# Patient Record
Sex: Female | Born: 1945
Health system: Southern US, Community
[De-identification: ages and names within clinical notes are randomized; demographics above are authoritative.]

## PROBLEM LIST (undated history)

## (undated) DIAGNOSIS — I219 Acute myocardial infarction, unspecified: Secondary | ICD-10-CM

## (undated) DIAGNOSIS — E7800 Pure hypercholesterolemia, unspecified: Secondary | ICD-10-CM

## (undated) DIAGNOSIS — I10 Essential (primary) hypertension: Secondary | ICD-10-CM

## (undated) DIAGNOSIS — I2510 Atherosclerotic heart disease of native coronary artery without angina pectoris: Secondary | ICD-10-CM

## (undated) HISTORY — PX: BREAST CYST EXCISION: SHX579

## (undated) HISTORY — PX: TONSILLECTOMY: SUR1361

---

## 2004-02-10 ENCOUNTER — Ambulatory Visit (HOSPITAL_COMMUNITY): Admission: RE | Admit: 2004-02-10 | Discharge: 2004-02-10 | Payer: Self-pay | Admitting: Family Medicine

## 2005-07-03 HISTORY — PX: CORONARY ANGIOPLASTY WITH STENT PLACEMENT: SHX49

## 2005-07-13 ENCOUNTER — Ambulatory Visit (HOSPITAL_COMMUNITY): Admission: RE | Admit: 2005-07-13 | Discharge: 2005-07-13 | Payer: Self-pay | Admitting: *Deleted

## 2005-07-20 ENCOUNTER — Ambulatory Visit (HOSPITAL_COMMUNITY): Admission: RE | Admit: 2005-07-20 | Discharge: 2005-07-21 | Payer: Self-pay | Admitting: Cardiology

## 2009-09-02 DIAGNOSIS — I2510 Atherosclerotic heart disease of native coronary artery without angina pectoris: Secondary | ICD-10-CM

## 2009-09-02 HISTORY — DX: Atherosclerotic heart disease of native coronary artery without angina pectoris: I25.10

## 2010-10-24 ENCOUNTER — Encounter: Payer: Self-pay | Admitting: Family Medicine

## 2012-08-12 ENCOUNTER — Emergency Department (HOSPITAL_COMMUNITY)
Admission: EM | Admit: 2012-08-12 | Discharge: 2012-08-12 | Disposition: A | Discharge status: Home / Self Care | Payer: BC Managed Care – PPO | Attending: Emergency Medicine | Admitting: Emergency Medicine

## 2012-08-12 ENCOUNTER — Encounter (HOSPITAL_COMMUNITY): Payer: Self-pay | Admitting: *Deleted

## 2012-08-12 ENCOUNTER — Emergency Department (HOSPITAL_COMMUNITY)
Admission: EM | Admit: 2012-08-12 | Discharge: 2012-08-12 | Disposition: A | Discharge status: Nursing Home (Includes ICF) | Payer: BC Managed Care – PPO | Source: Home / Self Care | Attending: Family Medicine | Admitting: Family Medicine

## 2012-08-12 ENCOUNTER — Encounter (HOSPITAL_COMMUNITY): Payer: Self-pay

## 2012-08-12 DIAGNOSIS — I252 Old myocardial infarction: Secondary | ICD-10-CM | POA: Insufficient documentation

## 2012-08-12 DIAGNOSIS — L03211 Cellulitis of face: Secondary | ICD-10-CM

## 2012-08-12 DIAGNOSIS — K047 Periapical abscess without sinus: Secondary | ICD-10-CM | POA: Insufficient documentation

## 2012-08-12 DIAGNOSIS — E7800 Pure hypercholesterolemia, unspecified: Secondary | ICD-10-CM | POA: Insufficient documentation

## 2012-08-12 DIAGNOSIS — Z79899 Other long term (current) drug therapy: Secondary | ICD-10-CM | POA: Insufficient documentation

## 2012-08-12 DIAGNOSIS — I10 Essential (primary) hypertension: Secondary | ICD-10-CM | POA: Insufficient documentation

## 2012-08-12 DIAGNOSIS — I2510 Atherosclerotic heart disease of native coronary artery without angina pectoris: Secondary | ICD-10-CM | POA: Insufficient documentation

## 2012-08-12 DIAGNOSIS — Z7982 Long term (current) use of aspirin: Secondary | ICD-10-CM | POA: Insufficient documentation

## 2012-08-12 HISTORY — DX: Essential (primary) hypertension: I10

## 2012-08-12 HISTORY — DX: Acute myocardial infarction, unspecified: I21.9

## 2012-08-12 HISTORY — DX: Pure hypercholesterolemia, unspecified: E78.00

## 2012-08-12 HISTORY — DX: Atherosclerotic heart disease of native coronary artery without angina pectoris: I25.10

## 2012-08-12 MED ORDER — HYDROCODONE-ACETAMINOPHEN 5-500 MG PO TABS: 1.0000 | ORAL_TABLET | Freq: Four times a day (QID) | ORAL | Status: DC | PRN

## 2012-08-12 MED ORDER — HYDROCODONE-ACETAMINOPHEN 5-325 MG PO TABS: 1.0000 | ORAL_TABLET | Freq: Once | ORAL | Status: DC

## 2012-08-12 MED ORDER — PENICILLIN V POTASSIUM 250 MG PO TABS: 250.0000 mg | ORAL_TABLET | Freq: Four times a day (QID) | ORAL | Status: AC

## 2012-08-12 NOTE — ED Notes (Signed)
Registration was in room with pt and she left prior to receiving pain medication. RN was waiting for medication to cross over in pyxis.

## 2012-08-12 NOTE — ED Provider Notes (Signed)
History     CSN: [id number]  Arrival date & time 08/12/12  1137   First MD Initiated Contact with Patient 08/12/12 1137      Chief Complaint  Patient presents with  . Facial Swelling  . Dental Pain    (Consider location/radiation/quality/duration/timing/severity/associated sxs/prior treatment) Patient is a 66 y.o. female presenting with tooth pain. The history is provided by the patient and a relative.  Dental PainThe primary symptoms include mouth pain and fever. The symptoms began yesterday (dental pain started on fri with facial swelling started on sat am, now worse.). The symptoms are worsening. The symptoms are chronic. The symptoms occur constantly.  Additional symptoms include: dental sensitivity to temperature, gum swelling, jaw pain, facial swelling and trouble swallowing. Medical issues include: periodontal disease.    Past Medical History  Diagnosis Date  . Coronary artery disease   . Myocardial infarction   . Hypertension   . Hypercholesteremia     Past Surgical History  Procedure Date  . Coronary angioplasty with stent placement     No family history on file.  History  Substance Use Topics  . Smoking status: Never Smoker   . Smokeless tobacco: Not on file  . Alcohol Use: No    OB History    Grav Para Term Preterm Abortions TAB SAB Ect Mult Living                  Review of Systems  Constitutional: Positive for fever.  HENT: Positive for facial swelling, trouble swallowing and dental problem.     Allergies  Review of patient's allergies indicates no known allergies.  Home Medications   Current Outpatient Rx  Name  Route  Sig  Dispense  Refill  . ASPIRIN 81 MG PO TABS   Oral   Take 81 mg by mouth daily.         Marland Kitchen PLAVIX PO   Oral   Take by mouth.         Marland Kitchen UNKNOWN TO PATIENT      HTN med         . UNKNOWN TO PATIENT      Cholesterol med           BP 136/70  Pulse 89  Temp 99.4 F (37.4 C) (Oral)  Resp 20  SpO2  100%  Physical Exam  Nursing note and vitals reviewed. Constitutional: She is oriented to person, place, and time. She appears distressed.  HENT:  Right Ear: External ear normal.  Left Ear: External ear normal.  Mouth/Throat:    Eyes: Pupils are equal, round, and reactive to light.  Neck: Normal range of motion. Neck supple.  Lymphadenopathy:    She has cervical adenopathy.  Neurological: She is alert and oriented to person, place, and time.  Skin: Skin is warm and dry.    ED Course  Procedures (including critical care time)  Labs Reviewed - No data to display No results found.   1. Dental abscess   2. Facial cellulitis       MDM          Linna Hoff, MD 08/12/12 1244

## 2012-08-12 NOTE — ED Notes (Signed)
Upon discharge of pt, pt rates pain 10/10.  Verbal order received from EDP for pain medication prior to discharge.

## 2012-08-12 NOTE — ED Notes (Signed)
Started with right lower dental pain on 11/8 - has since gotten significant swelling to right jaw and lower lip.  Has appt with dentist tomorrow.  Has been applying Orajel, but not taking any meds for pain.  Denies any tongue swelling or fevers.

## 2012-08-12 NOTE — ED Provider Notes (Addendum)
History  This chart was scribed for Ann Sprout, Ann Patel by Erskine Emery. This patient was seen in room TR10C/TR10C and the patient's care was started at 14:34.   CSN: [id number]  Arrival date & time 08/12/12  1259   First Ann Patel Initiated Contact with Patient 08/12/12 1434      Chief Complaint  Patient presents with  . Facial Swelling    (Consider location/radiation/quality/duration/timing/severity/associated sxs/prior treatment) The history is provided by the patient. No language interpreter was used.  Ann Patel is a 66 y.o. female who presents to the Emergency Department complaining of significant facial swelling due to a bad tooth since yesterday. Pt reports some associated difficulty swallowing. Pt has an appointment to see a dentist tomorrow.   Past Medical History  Diagnosis Date  . Coronary artery disease   . Myocardial infarction   . Hypertension   . Hypercholesteremia     Past Surgical History  Procedure Date  . Coronary angioplasty with stent placement     No family history on file.  History  Substance Use Topics  . Smoking status: Never Smoker   . Smokeless tobacco: Not on file  . Alcohol Use: No    OB History    Grav Para Term Preterm Abortions TAB SAB Ect Mult Living                  Review of Systems  Constitutional: Negative for fever and chills.  HENT: Positive for facial swelling, trouble swallowing and dental problem.   Respiratory: Negative for shortness of breath.   Gastrointestinal: Negative for nausea and vomiting.  Neurological: Negative for weakness.    Allergies  Review of patient's allergies indicates no known allergies.  Home Medications   Current Outpatient Rx  Name  Route  Sig  Dispense  Refill  . AMLODIPINE BESYLATE 10 MG PO TABS   Oral   Take 10 mg by mouth daily.         . ASPIRIN 81 MG PO TABS   Oral   Take 81 mg by mouth daily.         . ATORVASTATIN CALCIUM 20 MG PO TABS   Oral   Take 20 mg by mouth  daily.         Marland Kitchen CLOPIDOGREL BISULFATE 75 MG PO TABS   Oral   Take 75 mg by mouth daily.           Triage vitals: BP 114/66  Pulse 9  Temp 98.7 F (37.1 C) (Oral)  Resp 19  Ht 5\' 1"  (1.549 m)  Wt 150 lb (68.04 kg)  BMI 28.34 kg/m2  SpO2 98%  Physical Exam  Nursing note and vitals reviewed. Constitutional: She is oriented to person, place, and time. She appears well-developed and well-nourished. No distress.  HENT:  Head: Normocephalic and atraumatic.       Significant right-sided facial swelling. 1st molar is decayed and tender, with fluctuants along the gum. No sublingual or tongue swelling, no uvular edema.  Eyes: EOM are normal. Pupils are equal, round, and reactive to light.  Neck: Neck supple. No tracheal deviation present.  Cardiovascular: Normal rate.   Pulmonary/Chest: Effort normal. No respiratory distress.  Abdominal: Soft. She exhibits no distension.  Musculoskeletal: Normal range of motion. She exhibits no edema.  Neurological: She is alert and oriented to person, place, and time.  Skin: Skin is warm and dry.  Psychiatric: She has a normal mood and affect.    ED Course  Procedures (  including critical care time) DIAGNOSTIC STUDIES: Oxygen Saturation is 98% on room air, normal by my interpretation.    COORDINATION OF CARE: 14:58--I evaluated the patient and we discussed a treatment plan including incision and drainage to which the pt agreed. Pt will see the dentist tomorrow.  INCISION AND DRAINAGE PROCEDURE NOTE: Patient identification was confirmed and verbal consent was obtained. This procedure was performed by Ann Sprout, Ann Patel at 2:59 PM. Site: right-sided mouth Sterile procedures observed: yes Needle size: 27 Anesthetic used (type and amt): bupivacaine, 0.5% with epinephrine Blade size: 11 Drainage: minimal, almost no drainage Complexity: Simple Site anesthetized, incision made over site, wound drained and explored loculations, rinsed with  copious amounts of normal saline, wound packed with sterile gauze, covered with dry, sterile dressing.  Pt tolerated procedure well without complications.  Instructions for care discussed verbally and pt provided with additional written instructions for homecare and f/u.  I instructed the pt to take a pain pill in 4-5 hours to prevent the pain from coming back.   Labs Reviewed - No data to display No results found.   1. Dental abscess       MDM   Pt with dental caries and facial swelling.  No signs of ludwig's angina or difficulty swallowing and no systemic symptoms. Will treat with PCN and have pt f/u with dentist.      I personally performed the services described in this documentation, which was scribed in my presence.  The recorded information has been reviewed and considered.    Ann Sprout, Ann Patel 08/12/12 1512  Ann Sprout, Ann Patel 08/12/12 1513

## 2012-08-12 NOTE — ED Notes (Addendum)
Pt. Has decayed teeth lower rt. Side and woke up this am with facial swelling and pain.  Pt. Reports having a dental appointment tomorrow.

## 2012-08-12 NOTE — ED Notes (Signed)
Ice pack applied to lt. Facial swelling

## 2012-12-24 ENCOUNTER — Encounter: Payer: Self-pay | Admitting: *Deleted

## 2013-03-15 ENCOUNTER — Ambulatory Visit (INDEPENDENT_AMBULATORY_CARE_PROVIDER_SITE_OTHER): Payer: Medicare Other | Admitting: Cardiovascular Disease

## 2013-03-15 ENCOUNTER — Encounter: Payer: Self-pay | Admitting: Cardiovascular Disease

## 2013-03-15 VITALS — BP 140/80 | HR 71 | Resp 16 | Ht 60.0 in | Wt 142.0 lb

## 2013-03-15 DIAGNOSIS — I350 Nonrheumatic aortic (valve) stenosis: Secondary | ICD-10-CM | POA: Insufficient documentation

## 2013-03-15 DIAGNOSIS — I2510 Atherosclerotic heart disease of native coronary artery without angina pectoris: Secondary | ICD-10-CM

## 2013-03-15 DIAGNOSIS — R0989 Other specified symptoms and signs involving the circulatory and respiratory systems: Secondary | ICD-10-CM | POA: Insufficient documentation

## 2013-03-15 DIAGNOSIS — E7800 Pure hypercholesterolemia, unspecified: Secondary | ICD-10-CM

## 2013-03-15 DIAGNOSIS — I10 Essential (primary) hypertension: Secondary | ICD-10-CM | POA: Insufficient documentation

## 2013-03-15 DIAGNOSIS — Z79899 Other long term (current) drug therapy: Secondary | ICD-10-CM

## 2013-03-15 DIAGNOSIS — R011 Cardiac murmur, unspecified: Secondary | ICD-10-CM

## 2013-03-15 NOTE — Progress Notes (Signed)
Patient ID: Ann Patel, female   DOB: 1946/06/21, 67 y.o.   MRN: [id number]      Reason for office visit Coronary artery disease followup  Ann Patel has now retired but states that she is "working harder than ever" at home. She mows her own lawn and uses a weed eating. She can do this without any problems with shortness of breath or chest discomfort. Of note her angina symptoms in the past for primarily extreme shortness of breath and not chest tightness. She had a revascularization procedure with placement of drug-eluting stents to the mid right coronary artery in 2006 and has not had new coronary event since. She believes that she is doing well and remains physically very active.   No Known Allergies  Current Outpatient Prescriptions  Medication Sig Dispense Refill  . amLODipine (NORVASC) 10 MG tablet Take 10 mg by mouth daily.      Marland Kitchen aspirin 81 MG tablet Take 81 mg by mouth daily.      . clopidogrel (PLAVIX) 75 MG tablet Take 75 mg by mouth daily.      Marland Kitchen lisinopril (PRINIVIL,ZESTRIL) 20 MG tablet Take 20 mg by mouth daily.      Marland Kitchen atorvastatin (LIPITOR) 20 MG tablet Take 20 mg by mouth daily.       No current facility-administered medications for this visit.    Past Medical History  Diagnosis Date  . Coronary artery disease 09/2009    stress test EF 67%  . Myocardial infarction   . Hypertension   . Hypercholesteremia     Past Surgical History  Procedure Laterality Date  . Coronary angioplasty with stent placement Right 07/2005    receiving two consecutive drug eluting 2.5x67mm Taxus stents in the proximal to mid right coronary artery  . Tonsillectomy    . Breast cyst excision      fibro cyst removal    Family History  Problem Relation Age of Onset  . Stroke Mother 62    deceased  . Cancer Sister 47    deceased    History   Social History  . Marital Status: Divorced    Spouse Name: N/A    Number of Children: N/A  . Years of Education: N/A    Occupational History  . Not on file.   Social History Main Topics  . Smoking status: Never Smoker   . Smokeless tobacco: Never Used  . Alcohol Use: No  . Drug Use: No  . Sexually Active: Not on file   Other Topics Concern  . Not on file   Social History Narrative  . No narrative on file    Review of systems: The patient specifically denies any chest pain at rest or with exertion, dyspnea at rest or with exertion, orthopnea, paroxysmal nocturnal dyspnea, syncope, palpitations, focal neurological deficits, intermittent claudication, lower extremity edema, unexplained weight gain, cough, hemoptysis or wheezing.  The patient also denies abdominal pain, nausea, vomiting, dysphagia, diarrhea, constipation, polyuria, polydipsia, dysuria, hematuria, frequency, urgency, abnormal bleeding or bruising, fever, chills, unexpected weight changes, mood swings, change in skin or hair texture, change in voice quality, auditory or visual problems, allergic reactions or rashes, new musculoskeletal complaints other than usual "aches and pains".   PHYSICAL EXAM BP 140/80  Pulse 71  Resp 16  Ht 5' (1.524 m)  Wt 142 lb (64.411 kg)  BMI 27.73 kg/m2  General: Alert, oriented x3, no distress Head: no evidence of trauma, PERRL, EOMI, no exophtalmos or lid lag, no  myxedema, no xanthelasma; normal ears, nose and oropharynx Neck: normal jugular venous pulsations and no hepatojugular reflux; brisk carotid pulses without delay and bilateral carotid bruits: The right carotid bruit is systolic only in fairly faint, the left carotid bruit is very loud and unusually prolonged extending into diastole Chest: clear to auscultation, no signs of consolidation by percussion or palpation, normal fremitus, symmetrical and full respiratory excursions Cardiovascular: normal position and quality of the apical impulse, regular rhythm, normal first and second heart sounds, no rubs or gallops, she has a grade 3-4/6 systolic  murmur heard in the left lower sternal border but radiating up to the pulmonic focus along the left clavicle and possibly also up the left carotid Abdomen: no tenderness or distention, no masses by palpation, no abnormal pulsatility or arterial bruits, normal bowel sounds, no hepatosplenomegaly Extremities: no clubbing, cyanosis or edema; 2+ radial, ulnar and brachial pulses bilaterally; 2+ right femoral, posterior tibial and dorsalis pedis pulses; 2+ left femoral, posterior tibial and dorsalis pedis pulses; no subclavian or femoral bruits Neurological: grossly nonfocal   EKG: Normal sinus rhythm no acute repolarization abnormalities. Deep inferior Q waves are seen, consistent with old inferior wall myocardial infarction he  Lipid Panel  No results found for this basename: chol, trig, hdl, cholhdl, vldl, ldlcalc    BMET No results found for this basename: na, k, cl, co2, glucose, bun, creatinine, calcium, gfrnonaa, gfraa     ASSESSMENT AND PLAN  Murmur Her systolic murmur is considerably louder than I remember it being last year. It is very prominent at the left lower sternal border which is consistent with tricuspid regurgitation but it is also very well heard in the pulmonic focus radiating up into the area of the left clavicle. It still is more holosystolic in quality rather than ejection. I have recommended that we perform a followup echocardiogram to clarify its etiology.  Left carotid bruit This is also very allowed an unusually prolonged, seeming to cover both systole and the initial part of diastole. It raises the concern for very severe left carotid stenosis, although I wonder if it is somehow related to the unusual distribution of her systolic cardiac murmur. She has not had carotid ultrasonography performed since 2009 I have requested that this be repeated. Maybe she has a very high-grade subclavian stenosis with vertebral flow.  Coronary artery disease She received 2  drug-eluting stents to the mid right coronary artery October 2006 and her nuclear study from 2010 shows a medium-size basal and midinferior wall scar without ischemia. Chest preserved left extrasystolic function. She has New York Heart Association functional class I. Risk factors all appear to be appropriately addressed. I'm not sure whether clopidogrel is still providing a large benefit at this point, both like her to continue it until we clarify the carotid anatomy.  Hypercholesteremia It is time to reevaluate her lipid profile and liver tests  Hypertension Good control   Orders Placed This Encounter  Procedures  . Lipid Profile  . Comp Met (CMET)  . EKG 12-Lead  . 2D Echocardiogram with contrast  . Carotid duplex   No orders of the defined types were placed in this encounter.    Junious Silk, MD, [facility] [phone] office [phone] pager

## 2013-03-15 NOTE — Assessment & Plan Note (Signed)
Good control

## 2013-03-15 NOTE — Patient Instructions (Addendum)
Return in one year.  Get labwork done any morning at [facility] prior to any food or drink other than water with meds.  Echocardiogram and Carotid Doppler will be scheduled with our office.

## 2013-03-15 NOTE — Assessment & Plan Note (Signed)
She received 2 drug-eluting stents to the mid right coronary artery October 2006 and her nuclear study from 2010 shows a medium-size basal and midinferior wall scar without ischemia. Chest preserved left extrasystolic function. She has New York Heart Association functional class I. Risk factors all appear to be appropriately addressed. I'm not sure whether clopidogrel is still providing a large benefit at this point, both like her to continue it until we clarify the carotid anatomy.

## 2013-03-15 NOTE — Assessment & Plan Note (Signed)
It is time to reevaluate her lipid profile and liver tests

## 2013-03-15 NOTE — Assessment & Plan Note (Signed)
This is also very allowed an unusually prolonged, seeming to cover both systole and the initial part of diastole. It raises the concern for very severe left carotid stenosis, although I wonder if it is somehow related to the unusual distribution of her systolic cardiac murmur. She has not had carotid ultrasonography performed since 2009 I have requested that this be repeated. Maybe she has a very high-grade subclavian stenosis with vertebral flow.

## 2013-03-15 NOTE — Assessment & Plan Note (Signed)
Her systolic murmur is considerably louder than I remember it being last year. It is very prominent at the left lower sternal border which is consistent with tricuspid regurgitation but it is also very well heard in the pulmonic focus radiating up into the area of the left clavicle. It still is more holosystolic in quality rather than ejection. I have recommended that we perform a followup echocardiogram to clarify its etiology.

## 2013-03-27 LAB — LIPID PANEL
LDL Cholesterol: 67 mg/dL (ref 0–99)
Total CHOL/HDL Ratio: 2.4 Ratio
Triglycerides: 38 mg/dL (ref <150)
VLDL: 8 mg/dL (ref 0–40)

## 2013-03-27 LAB — COMPREHENSIVE METABOLIC PANEL
AST: 19 U/L (ref 0–37)
Albumin: 3.9 g/dL (ref 3.5–5.2)
Alkaline Phosphatase: 69 U/L (ref 39–117)
BUN: 12 mg/dL (ref 6–23)
Creat: 0.82 mg/dL (ref 0.50–1.10)
Glucose, Bld: 89 mg/dL (ref 70–99)
Potassium: 3.9 mEq/L (ref 3.5–5.3)
Sodium: 142 mEq/L (ref 135–145)
Total Bilirubin: 0.5 mg/dL (ref 0.3–1.2)
Total Protein: 6.7 g/dL (ref 6.0–8.3)

## 2013-03-29 ENCOUNTER — Telehealth: Payer: Self-pay | Admitting: *Deleted

## 2013-03-29 NOTE — Telephone Encounter (Signed)
Message copied by Vita Barley on Fri Mar 29, 2013 12:28 PM ------      Message from: Thurmon Fair      Created: Wed Mar 27, 2013  5:29 PM       Lipids and other labs excellent, please notify ------

## 2013-03-29 NOTE — Telephone Encounter (Signed)
[facility] w/Normal lab results

## 2013-04-08 ENCOUNTER — Ambulatory Visit (HOSPITAL_BASED_OUTPATIENT_CLINIC_OR_DEPARTMENT_OTHER)
Admission: RE | Admit: 2013-04-08 | Discharge: 2013-04-08 | Disposition: A | Discharge status: Home / Self Care | Payer: Medicare Other | Source: Ambulatory Visit | Attending: Cardiology | Admitting: Cardiology

## 2013-04-08 ENCOUNTER — Ambulatory Visit (HOSPITAL_COMMUNITY)
Admission: RE | Admit: 2013-04-08 | Discharge: 2013-04-08 | Disposition: A | Discharge status: Home / Self Care | Payer: Medicare Other | Source: Ambulatory Visit | Attending: Cardiology | Admitting: Cardiology

## 2013-04-08 DIAGNOSIS — Z9861 Coronary angioplasty status: Secondary | ICD-10-CM | POA: Insufficient documentation

## 2013-04-08 DIAGNOSIS — I10 Essential (primary) hypertension: Secondary | ICD-10-CM | POA: Insufficient documentation

## 2013-04-08 DIAGNOSIS — I2510 Atherosclerotic heart disease of native coronary artery without angina pectoris: Secondary | ICD-10-CM | POA: Insufficient documentation

## 2013-04-08 DIAGNOSIS — E785 Hyperlipidemia, unspecified: Secondary | ICD-10-CM | POA: Insufficient documentation

## 2013-04-08 DIAGNOSIS — R011 Cardiac murmur, unspecified: Secondary | ICD-10-CM

## 2013-04-08 DIAGNOSIS — R0989 Other specified symptoms and signs involving the circulatory and respiratory systems: Secondary | ICD-10-CM

## 2013-04-08 NOTE — Progress Notes (Signed)
2D Echo Performed 04/08/2013    Airon Sahni, RCS  

## 2013-04-08 NOTE — Progress Notes (Signed)
Carotid Duplex Completed. °Ann Patel ° °

## 2013-04-15 ENCOUNTER — Telehealth: Payer: Self-pay | Admitting: *Deleted

## 2013-04-15 NOTE — Telephone Encounter (Signed)
Left message to call back - about test results 

## 2013-04-15 NOTE — Telephone Encounter (Signed)
Message copied by Tobin Chad on Mon Apr 15, 2013  6:32 PM ------      Message from: Thurmon Fair      Created: Sun Apr 14, 2013  7:07 PM       No serious cardiac abnormalities on echo. Murmur appears to be due to aortic valve sclerosis with minimal stenosis. No further testing is recommended. Carotids were OK ------

## 2013-04-19 ENCOUNTER — Telehealth: Payer: Self-pay | Admitting: *Deleted

## 2013-04-19 NOTE — Telephone Encounter (Signed)
Message copied by Vita Barley on Fri Apr 19, 2013  9:04 PM ------      Message from: [place], [place]      Created: Sun Apr 14, 2013  6:55 PM       Mild carotid plaque, no need to evaluate further ------

## 2013-04-19 NOTE — Telephone Encounter (Signed)
Message copied by Vita Barley on Fri Apr 19, 2013  8:58 PM ------      Message from: [place], [place]      Created: Sun Apr 14, 2013  6:55 PM       Mild carotid plaque, no need to evaluate further ------

## 2013-04-19 NOTE — Telephone Encounter (Signed)
LMOM to call for carotid doppler results Monday

## 2013-04-19 NOTE — Telephone Encounter (Signed)
[facility] Monday for Carotid results at work #

## 2013-04-22 ENCOUNTER — Telehealth: Payer: Self-pay | Admitting: *Deleted

## 2013-04-22 ENCOUNTER — Telehealth: Payer: Self-pay | Admitting: Cardiovascular Disease

## 2013-04-22 NOTE — Telephone Encounter (Signed)
Ann Patel returned your call about her results.

## 2013-04-22 NOTE — Telephone Encounter (Signed)
Message copied by Vita Barley on Mon Apr 22, 2013 10:47 AM ------      Message from: [place], [place]      Created: Sun Apr 14, 2013  6:55 PM       Mild carotid plaque, no need to evaluate further ------

## 2013-04-22 NOTE — Telephone Encounter (Signed)
Have tried numerous messages for a call back for carotid doppler results.  Left message w/results today on voice mail.

## 2013-05-24 ENCOUNTER — Other Ambulatory Visit: Payer: Self-pay | Admitting: Cardiovascular Disease

## 2013-05-24 NOTE — Telephone Encounter (Signed)
Rx was sent to pharmacy electronically. 

## 2013-06-07 ENCOUNTER — Other Ambulatory Visit: Payer: Self-pay | Admitting: *Deleted

## 2013-06-07 MED ORDER — LISINOPRIL 20 MG PO TABS: 20.0000 mg | ORAL_TABLET | Freq: Every day | ORAL | Status: DC

## 2013-06-07 NOTE — Telephone Encounter (Signed)
Rx was sent to pharmacy electronically. 

## 2013-09-20 ENCOUNTER — Other Ambulatory Visit: Payer: Self-pay | Admitting: *Deleted

## 2013-09-20 MED ORDER — CLOPIDOGREL BISULFATE 75 MG PO TABS: 75.0000 mg | ORAL_TABLET | Freq: Every day | ORAL | Status: DC

## 2013-09-20 NOTE — Telephone Encounter (Signed)
Rx was sent to pharmacy electronically. 

## 2013-10-09 ENCOUNTER — Other Ambulatory Visit (HOSPITAL_COMMUNITY): Payer: Self-pay | Admitting: Family Medicine

## 2013-10-09 DIAGNOSIS — N6019 Diffuse cystic mastopathy of unspecified breast: Secondary | ICD-10-CM

## 2013-10-09 DIAGNOSIS — Z0000 Encounter for general adult medical examination without abnormal findings: Secondary | ICD-10-CM

## 2013-10-14 ENCOUNTER — Ambulatory Visit (HOSPITAL_COMMUNITY)
Admission: RE | Admit: 2013-10-14 | Discharge: 2013-10-14 | Disposition: A | Discharge status: Home / Self Care | Payer: Medicare HMO | Source: Ambulatory Visit | Attending: Family Medicine | Admitting: Family Medicine

## 2013-10-14 DIAGNOSIS — Z0000 Encounter for general adult medical examination without abnormal findings: Secondary | ICD-10-CM

## 2013-10-14 DIAGNOSIS — Z1231 Encounter for screening mammogram for malignant neoplasm of breast: Secondary | ICD-10-CM | POA: Insufficient documentation

## 2013-10-14 DIAGNOSIS — N6019 Diffuse cystic mastopathy of unspecified breast: Secondary | ICD-10-CM

## 2013-10-30 ENCOUNTER — Telehealth: Payer: Self-pay

## 2013-10-30 NOTE — Telephone Encounter (Signed)
LMOM to call back

## 2013-11-12 NOTE — Telephone Encounter (Signed)
Letter to PCP. Pt has not returned call or responded to letter.

## 2013-12-13 ENCOUNTER — Other Ambulatory Visit (HOSPITAL_COMMUNITY): Payer: Self-pay | Admitting: Physician Assistant

## 2013-12-13 ENCOUNTER — Ambulatory Visit (HOSPITAL_COMMUNITY)
Admission: RE | Admit: 2013-12-13 | Discharge: 2013-12-13 | Disposition: A | Discharge status: Home / Self Care | Payer: Medicare HMO | Source: Ambulatory Visit | Attending: Physician Assistant | Admitting: Physician Assistant

## 2013-12-13 DIAGNOSIS — M259 Joint disorder, unspecified: Secondary | ICD-10-CM | POA: Insufficient documentation

## 2013-12-13 DIAGNOSIS — M25569 Pain in unspecified knee: Secondary | ICD-10-CM

## 2014-01-06 ENCOUNTER — Telehealth: Payer: Self-pay | Admitting: Cardiovascular Disease

## 2014-01-06 NOTE — Telephone Encounter (Signed)
Left message okay to take the hydrocodone and plavix .

## 2014-01-06 NOTE — Telephone Encounter (Signed)
Went to her primary doctor and he put her on Hydrocodone. She wants to know if this is all right for her to take with Plavix?

## 2014-04-25 ENCOUNTER — Encounter: Payer: Self-pay | Admitting: Cardiovascular Disease

## 2014-04-25 ENCOUNTER — Ambulatory Visit (INDEPENDENT_AMBULATORY_CARE_PROVIDER_SITE_OTHER): Payer: Medicare HMO | Admitting: Cardiovascular Disease

## 2014-04-25 VITALS — BP 134/62 | HR 83 | Resp 16 | Ht 61.0 in | Wt 147.2 lb

## 2014-04-25 DIAGNOSIS — I2510 Atherosclerotic heart disease of native coronary artery without angina pectoris: Secondary | ICD-10-CM

## 2014-04-25 DIAGNOSIS — E7800 Pure hypercholesterolemia, unspecified: Secondary | ICD-10-CM

## 2014-04-25 DIAGNOSIS — I10 Essential (primary) hypertension: Secondary | ICD-10-CM

## 2014-04-25 DIAGNOSIS — Z79899 Other long term (current) drug therapy: Secondary | ICD-10-CM

## 2014-04-25 MED ORDER — AMLODIPINE BESYLATE 10 MG PO TABS: ORAL_TABLET | ORAL | Status: DC

## 2014-04-25 MED ORDER — CLOPIDOGREL BISULFATE 75 MG PO TABS: 75.0000 mg | ORAL_TABLET | Freq: Every day | ORAL | Status: DC

## 2014-04-25 MED ORDER — ATORVASTATIN CALCIUM 20 MG PO TABS: 20.0000 mg | ORAL_TABLET | Freq: Every day | ORAL | Status: DC

## 2014-04-25 MED ORDER — LISINOPRIL 20 MG PO TABS: 20.0000 mg | ORAL_TABLET | Freq: Every day | ORAL | Status: DC

## 2014-04-25 NOTE — Progress Notes (Signed)
Patient ID: Ann Patel, female   DOB: Nov 26, 1945, 68 y.o.   MRN: [id number]      Reason for office visit CAD, hypertension, hyperlipidemia  Ann Patel had drug-eluting stents placed in the mid right coronary artery in 2006 but has not had any coronary problems in the next almost 10 years. She has very mild degenerative aortic valve stenosis and bilateral carotid bruits without significant obstruction by duplex ultrasonography. She has treated hypertension and hyperlipidemia She remains physically very active, doing all her yard work by herself. She denies an exertional dizziness, angina or dyspnea.   No Known Allergies  Current Outpatient Prescriptions  Medication Sig Dispense Refill  . amLODipine (NORVASC) 10 MG tablet TAKE ONE TABLET BY MOUTH EVERY DAY  90 tablet  3  . aspirin 81 MG tablet Take 81 mg by mouth daily.      Marland Kitchen atorvastatin (LIPITOR) 20 MG tablet Take 20 mg by mouth daily.      . clopidogrel (PLAVIX) 75 MG tablet Take 1 tablet (75 mg total) by mouth daily.  30 tablet  6  . lisinopril (PRINIVIL,ZESTRIL) 20 MG tablet Take 1 tablet (20 mg total) by mouth daily.  90 tablet  3   No current facility-administered medications for this visit.    Past Medical History  Diagnosis Date  . Coronary artery disease 09/2009    stress test EF 67%  . Myocardial infarction   . Hypertension   . Hypercholesteremia     Past Surgical History  Procedure Laterality Date  . Coronary angioplasty with stent placement Right 07/2005    receiving two consecutive drug eluting 2.5x10mm Taxus stents in the proximal to mid right coronary artery  . Tonsillectomy    . Breast cyst excision      fibro cyst removal    Family History  Problem Relation Age of Onset  . Stroke Mother 26    deceased  . Cancer Sister 28    deceased    History   Social History  . Marital Status: Divorced    Spouse Name: N/A    Number of Children: N/A  . Years of Education: N/A   Occupational History  .  Not on file.   Social History Main Topics  . Smoking status: Never Smoker   . Smokeless tobacco: Never Used  . Alcohol Use: No  . Drug Use: No  . Sexual Activity: Not on file   Other Topics Concern  . Not on file   Social History Narrative  . No narrative on file    Review of systems: The patient specifically denies any chest pain at rest or with exertion, dyspnea at rest or with exertion, orthopnea, paroxysmal nocturnal dyspnea, syncope, palpitations, focal neurological deficits, intermittent claudication, lower extremity edema, unexplained weight gain, cough, hemoptysis or wheezing.  The patient also denies abdominal pain, nausea, vomiting, dysphagia, diarrhea, constipation, polyuria, polydipsia, dysuria, hematuria, frequency, urgency, abnormal bleeding or bruising, fever, chills, unexpected weight changes, mood swings, change in skin or hair texture, change in voice quality, auditory or visual problems, allergic reactions or rashes, new musculoskeletal complaints other than usual "aches and pains".   PHYSICAL EXAM BP 134/62  Pulse 83  Resp 16  Ht 5\' 1"  (1.549 m)  Wt 147 lb 3.2 oz (66.769 kg)  BMI 27.83 kg/m2 General: Alert, oriented x3, no distress  Head: no evidence of trauma, PERRL, EOMI, no exophtalmos or lid lag, no myxedema, no xanthelasma; normal ears, nose and oropharynx  Neck: normal jugular venous  pulsations and no hepatojugular reflux; brisk carotid pulses without delay and bilateral carotid bruits: The right carotid bruit is systolic only in fairly faint, the left carotid bruit is very loud and unusually prolonged extending into diastole  Chest: clear to auscultation, no signs of consolidation by percussion or palpation, normal fremitus, symmetrical and full respiratory excursions  Cardiovascular: normal position and quality of the apical impulse, regular rhythm, normal first and second heart sounds, no rubs or gallops, she has a grade 7-7/9 systolic murmur heard in the  left lower sternal border but radiating up to the pulmonic focus along the left clavicle and possibly also up the left carotid  Abdomen: no tenderness or distention, no masses by palpation, no abnormal pulsatility or arterial bruits, normal bowel sounds, no hepatosplenomegaly  Extremities: no clubbing, cyanosis or edema; 2+ radial, ulnar and brachial pulses bilaterally; 2+ right femoral, posterior tibial and dorsalis pedis pulses; 2+ left femoral, posterior tibial and dorsalis pedis pulses; no subclavian or femoral bruits  Neurological: grossly nonfocal   EKG: NSR, inferior wall Q waves, no acute repolarization abnormalities  Lipid Panel     Component Value Date/Time   CHOL 130 03/27/2013 0812   TRIG 38 03/27/2013 0812   HDL 55 03/27/2013 0812   CHOLHDL 2.4 03/27/2013 0812   VLDL 8 03/27/2013 0812   LDLCALC 67 03/27/2013 0812    BMET    Component Value Date/Time   NA 142 03/27/2013 0812   K 3.9 03/27/2013 0812   CL 106 03/27/2013 0812   CO2 28 03/27/2013 0812   GLUCOSE 89 03/27/2013 0812   BUN 12 03/27/2013 0812   CREATININE 0.82 03/27/2013 0812   CALCIUM 9.3 03/27/2013 0812     ASSESSMENT AND PLAN  Coronary artery disease  She received 2 drug-eluting stents to the mid right coronary artery October 2006 and her nuclear study from 2010 shows a medium-size basal and midinferior wall scar without ischemia. She has preserved left extrasystolic function. She has New York Heart Association functional class I. Risk factors all appear to be appropriately addressed.  Hypercholesteremia  It is time to reevaluate her lipid profile and liver tests   Hypertension  Good control  Minimal aortic valve stenosis Reevaluation is not necessary unless he develops exertional symptoms  Shamica Moree  Sanda Klein, MD, [facility] [phone] office [phone] pager

## 2014-04-25 NOTE — Patient Instructions (Signed)
Your physician recommends that you return for lab work FASTING.  Your physician recommends that you schedule a follow-up appointment in: 1 year. No changes were made today in your therapy.

## 2014-04-29 LAB — CBC
HCT: 38.2 % (ref 36.0–46.0)
Hemoglobin: 12.5 g/dL (ref 12.0–15.0)
MCH: 25.5 pg — ABNORMAL LOW (ref 26.0–34.0)
MCHC: 32.7 g/dL (ref 30.0–36.0)
MCV: 77.8 fL — ABNORMAL LOW (ref 78.0–100.0)
Platelets: 214 10*3/uL (ref 150–400)
RBC: 4.91 MIL/uL (ref 3.87–5.11)
RDW: 15.5 % (ref 11.5–15.5)
WBC: 5.1 10*3/uL (ref 4.0–10.5)

## 2014-04-29 LAB — COMPREHENSIVE METABOLIC PANEL
ALT: 13 U/L (ref 0–35)
AST: 19 U/L (ref 0–37)
Albumin: 4.1 g/dL (ref 3.5–5.2)
Alkaline Phosphatase: 70 U/L (ref 39–117)
BUN: 11 mg/dL (ref 6–23)
CHLORIDE: 107 meq/L (ref 96–112)
CO2: 27 meq/L (ref 19–32)
Calcium: 9.1 mg/dL (ref 8.4–10.5)
Creat: 0.83 mg/dL (ref 0.50–1.10)
Glucose, Bld: 90 mg/dL (ref 70–99)
Potassium: 4.1 mEq/L (ref 3.5–5.3)
Sodium: 141 mEq/L (ref 135–145)
Total Bilirubin: 0.4 mg/dL (ref 0.2–1.2)
Total Protein: 6.5 g/dL (ref 6.0–8.3)

## 2014-04-29 LAB — LIPID PANEL
CHOLESTEROL: 113 mg/dL (ref 0–200)
HDL: 43 mg/dL (ref >39)
LDL Cholesterol: 57 mg/dL (ref 0–99)
Total CHOL/HDL Ratio: 2.6 Ratio
Triglycerides: 63 mg/dL (ref <150)
VLDL: 13 mg/dL (ref 0–40)

## 2014-04-30 ENCOUNTER — Encounter: Payer: Self-pay | Admitting: *Deleted

## 2014-07-28 ENCOUNTER — Other Ambulatory Visit: Payer: Self-pay | Admitting: Cardiovascular Disease

## 2014-07-29 NOTE — Telephone Encounter (Signed)
Refilled #90 tablets with 3 refills on 04/25/2014

## 2014-08-04 ENCOUNTER — Telehealth: Payer: Self-pay | Admitting: Cardiovascular Disease

## 2014-08-04 NOTE — Telephone Encounter (Signed)
Ms. dicarlo have some questions about her medication .Marland Kitchen Please call   Thanks

## 2014-08-04 NOTE — Telephone Encounter (Signed)
Returned call to patient no answer.Unable to leave a message no voice mail. ?

## 2014-08-05 NOTE — Telephone Encounter (Signed)
Attempted to call - no answer - no VM available

## 2014-08-06 NOTE — Telephone Encounter (Signed)
I attempted to call the patient. No answer/ no voice mail set up.

## 2014-08-11 NOTE — Telephone Encounter (Signed)
RN attempted call - no answer- no voicemail

## 2014-08-12 NOTE — Telephone Encounter (Signed)
No answer ,will close encounter await for patient to call back

## 2014-10-27 DIAGNOSIS — Z0000 Encounter for general adult medical examination without abnormal findings: Secondary | ICD-10-CM | POA: Diagnosis not present

## 2014-10-27 DIAGNOSIS — E663 Overweight: Secondary | ICD-10-CM | POA: Diagnosis not present

## 2014-10-27 DIAGNOSIS — Z6829 Body mass index (BMI) 29.0-29.9, adult: Secondary | ICD-10-CM | POA: Diagnosis not present

## 2015-01-07 ENCOUNTER — Other Ambulatory Visit: Payer: Self-pay | Admitting: Cardiovascular Disease

## 2015-01-07 NOTE — Telephone Encounter (Signed)
Rx(s) sent to pharmacy electronically.  

## 2015-01-23 ENCOUNTER — Telehealth: Payer: Self-pay | Admitting: Cardiovascular Disease

## 2015-01-26 NOTE — Telephone Encounter (Signed)
Close encounter 

## 2015-05-06 ENCOUNTER — Ambulatory Visit (INDEPENDENT_AMBULATORY_CARE_PROVIDER_SITE_OTHER): Payer: Commercial Managed Care - HMO | Admitting: Cardiovascular Disease

## 2015-05-06 VITALS — BP 140/62 | HR 83 | Resp 16 | Ht 61.0 in | Wt 147.8 lb

## 2015-05-06 DIAGNOSIS — I10 Essential (primary) hypertension: Secondary | ICD-10-CM | POA: Diagnosis not present

## 2015-05-06 DIAGNOSIS — E7800 Pure hypercholesterolemia, unspecified: Secondary | ICD-10-CM

## 2015-05-06 DIAGNOSIS — I2510 Atherosclerotic heart disease of native coronary artery without angina pectoris: Secondary | ICD-10-CM | POA: Diagnosis not present

## 2015-05-06 DIAGNOSIS — I350 Nonrheumatic aortic (valve) stenosis: Secondary | ICD-10-CM | POA: Diagnosis not present

## 2015-05-06 NOTE — Progress Notes (Signed)
Patient ID: [name], female   DOB: Jul 21, 1946, 69 y.o.   MRN: [id number]     Cardiology Office Note   Date:  05/08/2015   ID:  Ann Patel, DOB April 29, 1946, MRN [id number]  PCP:  Collene Mares, PA-C  Cardiologist:   Sanda Klein, MD   Chief Complaint  Patient presents with  . Annual Exam      History of Present Illness: Ann Patel is a 69 y.o. female who presents for coronary artery disease, mild aortic stenosis, hypertension and hyperlipidemia.  She has had a great year. She has not had any adverse cardiac events or other new health problems. She exercises every day. In fact she walks 6-7 miles on a daily basis and never complains of chest pain or shortness of breath. She has not expressed syncope, dizziness or palpitations and denies lower extremity edema. She has not had bleeding complications or focal neurological deficits. She is now roughly 10 years status post placement of drug-eluting stents in the mid right coronary artery. She is not have mild degenerative aortic valve stenosis and has radiated carotid bruits without significant carotid obstruction by previous ultrasound. Her hypertension and hyperlipidemia are well treated. Normal left ventricular systolic function. Nuclear stress test in 2010 showed a medium size scar in the basal and mid inferior wall segments, without reversible ischemia.   Past Medical History  Diagnosis Date  . Coronary artery disease 09/2009    stress test EF 67%  . Myocardial infarction   . Hypertension   . Hypercholesteremia     Past Surgical History  Procedure Laterality Date  . Coronary angioplasty with stent placement Right 07/2005    receiving two consecutive drug eluting 2.5x66mm Taxus stents in the proximal to mid right coronary artery  . Tonsillectomy    . Breast cyst excision      fibro cyst removal     Current Outpatient Prescriptions  Medication Sig Dispense Refill  . amLODipine (NORVASC) 10 MG tablet TAKE  ONE TABLET BY MOUTH EVERY DAY 90 tablet 3  . aspirin 81 MG tablet Take 81 mg by mouth daily.    Marland Kitchen atorvastatin (LIPITOR) 20 MG tablet Take 1 tablet (20 mg total) by mouth daily. 90 tablet 3  . clopidogrel (PLAVIX) 75 MG tablet Take 1 tablet (75 mg total) by mouth daily. 30 tablet 3  . lisinopril (PRINIVIL,ZESTRIL) 20 MG tablet Take 1 tablet (20 mg total) by mouth daily. 90 tablet 3   No current facility-administered medications for this visit.    Allergies:   Review of patient's allergies indicates no known allergies.    Social History:  The patient  reports that she has never smoked. She has never used smokeless tobacco. She reports that she does not drink alcohol or use illicit drugs.   Family History:  The patient's family history includes Cancer (age of onset: 49) in her sister; Stroke (age of onset: 52) in her mother.    ROS:  Please see the history of present illness.    Otherwise, review of systems positive for none.   All other systems are reviewed and negative.    PHYSICAL EXAM: VS:  BP 140/62 mmHg  Pulse 83  Resp 16  Ht 5\' 1"  (1.549 m)  Wt 147 lb 12.8 oz (67.042 kg)  BMI 27.94 kg/m2 , BMI Body mass index is 27.94 kg/(m^2).  General: Alert, oriented x3, no distress Head: no evidence of trauma, PERRL, EOMI, no exophtalmos or lid lag, no myxedema, no xanthelasma;  normal ears, nose and oropharynx Neck: normal jugular venous pulsations and no hepatojugular reflux; brisk carotid pulses without delay and distinct bilateral carotid bruits Chest: clear to auscultation, no signs of consolidation by percussion or palpation, normal fremitus, symmetrical and full respiratory excursions Cardiovascular: normal position and quality of the apical impulse, regular rhythm, normal first and second heart sounds, 2/6 early peaking aortic ejection murmurs, rubs or gallops Abdomen: no tenderness or distention, no masses by palpation, no abnormal pulsatility or arterial bruits, normal bowel  sounds, no hepatosplenomegaly Extremities: no clubbing, cyanosis or edema; 2+ radial, ulnar and brachial pulses bilaterally; 2+ right femoral, posterior tibial and dorsalis pedis pulses; 2+ left femoral, posterior tibial and dorsalis pedis pulses; no subclavian or femoral bruits Neurological: grossly nonfocal Psych: euthymic mood, full affect   EKG:  EKG is ordered today. The ekg ordered today demnormal sinus rhythm, QTC 453 ms but otherwise normal   Recent Labs: 05/07/2015: ALT 28; BUN 11; Creatinine, Ser 0.89; Potassium 4.0; Sodium 143    Lipid Panel    Component Value Date/Time   CHOL 144 05/07/2015 0823   CHOL 113 04/29/2014 0853   TRIG 68 05/07/2015 0823   HDL 49 05/07/2015 0823   HDL 43 04/29/2014 0853   CHOLHDL 2.6 04/29/2014 0853   VLDL 13 04/29/2014 0853   LDLCALC 81 05/07/2015 0823   LDLCALC 57 04/29/2014 0853      Wt Readings from Last 3 Encounters:  05/06/15 147 lb 12.8 oz (67.042 kg)  04/25/14 147 lb 3.2 oz (66.769 kg)  03/15/13 142 lb (64.411 kg)   .   ASSESSMENT AND PLAN:  1. CAD s/p RCA stensts 2006 - asymptomatic.  2. Hyperlipidemia with good lipid profile, although her LDL has increased since last year, her HDL has also improved. No changes are made her medications  3. Hypertension well controlled (at home her blood pressures usually in the 120s/60s) . 4. Very mild aortic valve stenosis, asymptomatic    Current medicines are reviewed at length with the patient today.  The patient does not have concerns regarding medicines.  The following changes have been made:  no change  Labs/ tests ordered today include:  Orders Placed This Encounter  Procedures  . COMPLETE METABOLIC PANEL WITH GFR  . Lipid Profile  . EKG 12-Lead   Patient Instructions  Your physician wants you to follow-up in: 1 Year. You will receive a reminder letter in the mail two months in advance. If you don't receive a letter, please call our office to schedule the follow-up  appointment.  Your physician recommends that you return for lab work CMP and Fasting Lipids       Signed, Sanda Klein, MD  05/08/2015 6:40 PM    Sanda Klein, MD, [facility] [phone] office [phone] pager

## 2015-05-06 NOTE — Patient Instructions (Addendum)
Your physician wants you to follow-up in: 1 Year. You will receive a reminder letter in the mail two months in advance. If you don't receive a letter, please call our office to schedule the follow-up appointment.  Your physician recommends that you return for lab work CMP and Fasting Lipids

## 2015-05-07 ENCOUNTER — Other Ambulatory Visit: Payer: Self-pay | Admitting: Cardiovascular Disease

## 2015-05-07 DIAGNOSIS — E780 Pure hypercholesterolemia: Secondary | ICD-10-CM | POA: Diagnosis not present

## 2015-05-07 DIAGNOSIS — I10 Essential (primary) hypertension: Secondary | ICD-10-CM | POA: Diagnosis not present

## 2015-05-08 ENCOUNTER — Encounter: Payer: Self-pay | Admitting: *Deleted

## 2015-05-08 ENCOUNTER — Encounter: Payer: Self-pay | Admitting: Cardiovascular Disease

## 2015-05-08 LAB — COMPREHENSIVE METABOLIC PANEL
ALT: 28 IU/L (ref 0–32)
AST: 28 IU/L (ref 0–40)
Albumin/Globulin Ratio: 1.7 (ref 1.1–2.5)
Albumin: 4.1 g/dL (ref 3.6–4.8)
Alkaline Phosphatase: 107 IU/L (ref 39–117)
BUN/Creatinine Ratio: 12 (ref 11–26)
BUN: 11 mg/dL (ref 8–27)
Bilirubin Total: 0.3 mg/dL (ref 0.0–1.2)
CALCIUM: 9.2 mg/dL (ref 8.7–10.3)
CO2: 24 mmol/L (ref 18–29)
Chloride: 104 mmol/L (ref 97–108)
Creatinine, Ser: 0.89 mg/dL (ref 0.57–1.00)
GFR calc non Af Amer: 66 mL/min/{1.73_m2} (ref >59)
GFR, EST AFRICAN AMERICAN: 76 mL/min/{1.73_m2} (ref >59)
Globulin, Total: 2.4 g/dL (ref 1.5–4.5)
Glucose: 104 mg/dL — ABNORMAL HIGH (ref 65–99)
Potassium: 4 mmol/L (ref 3.5–5.2)
Sodium: 143 mmol/L (ref 134–144)
Total Protein: 6.5 g/dL (ref 6.0–8.5)

## 2015-05-08 LAB — LIPID PANEL W/O CHOL/HDL RATIO
Cholesterol, Total: 144 mg/dL (ref 100–199)
HDL: 49 mg/dL (ref >39)
LDL CALC: 81 mg/dL (ref 0–99)
Triglycerides: 68 mg/dL (ref 0–149)
VLDL Cholesterol Cal: 14 mg/dL (ref 5–40)

## 2015-05-14 ENCOUNTER — Other Ambulatory Visit: Payer: Self-pay | Admitting: Cardiovascular Disease

## 2015-05-14 NOTE — Telephone Encounter (Signed)
Pt said all her medicine was supposed to have been called in with #90 and refills. Please call them to [place].

## 2015-05-14 NOTE — Telephone Encounter (Signed)
Rx request sent to pharmacy.  

## 2015-05-19 ENCOUNTER — Other Ambulatory Visit (HOSPITAL_COMMUNITY): Payer: Self-pay | Admitting: Family Medicine

## 2015-05-19 ENCOUNTER — Ambulatory Visit (HOSPITAL_COMMUNITY)
Admission: RE | Admit: 2015-05-19 | Discharge: 2015-05-19 | Disposition: A | Discharge status: Home / Self Care | Payer: Commercial Managed Care - HMO | Source: Ambulatory Visit | Attending: Family Medicine | Admitting: Family Medicine

## 2015-05-19 DIAGNOSIS — Z6828 Body mass index (BMI) 28.0-28.9, adult: Secondary | ICD-10-CM | POA: Diagnosis not present

## 2015-05-19 DIAGNOSIS — M25511 Pain in right shoulder: Secondary | ICD-10-CM

## 2015-05-19 DIAGNOSIS — E663 Overweight: Secondary | ICD-10-CM | POA: Diagnosis not present

## 2015-05-19 DIAGNOSIS — Z1389 Encounter for screening for other disorder: Secondary | ICD-10-CM | POA: Diagnosis not present

## 2015-05-20 ENCOUNTER — Other Ambulatory Visit: Payer: Self-pay | Admitting: Cardiovascular Disease

## 2015-05-20 MED ORDER — AMLODIPINE BESYLATE 10 MG PO TABS: ORAL_TABLET | ORAL | Status: DC

## 2015-05-20 MED ORDER — LISINOPRIL 20 MG PO TABS: 20.0000 mg | ORAL_TABLET | Freq: Every day | ORAL | Status: DC

## 2015-05-20 NOTE — Telephone Encounter (Signed)
Rx(s) sent to pharmacy electronically.  

## 2015-05-20 NOTE — Telephone Encounter (Signed)
°  1. Which medications need to be refilled? Atorvastatin, Lisinopril, Amlodipine  2. Which pharmacy is medication to be sent to? [facility]   3. Do they need a 30 day or 90 day supply? 90  4. Would they like a call back once the medication has been sent to the pharmacy? Yes

## 2015-07-02 ENCOUNTER — Other Ambulatory Visit (HOSPITAL_COMMUNITY): Payer: Self-pay | Admitting: Family Medicine

## 2016-01-19 DIAGNOSIS — Z1389 Encounter for screening for other disorder: Secondary | ICD-10-CM | POA: Diagnosis not present

## 2016-01-19 DIAGNOSIS — J302 Other seasonal allergic rhinitis: Secondary | ICD-10-CM | POA: Diagnosis not present

## 2016-01-19 DIAGNOSIS — Z6827 Body mass index (BMI) 27.0-27.9, adult: Secondary | ICD-10-CM | POA: Diagnosis not present

## 2016-01-19 DIAGNOSIS — R062 Wheezing: Secondary | ICD-10-CM | POA: Diagnosis not present

## 2016-01-19 DIAGNOSIS — I10 Essential (primary) hypertension: Secondary | ICD-10-CM | POA: Diagnosis not present

## 2016-01-27 DIAGNOSIS — E663 Overweight: Secondary | ICD-10-CM | POA: Diagnosis not present

## 2016-01-27 DIAGNOSIS — Z0000 Encounter for general adult medical examination without abnormal findings: Secondary | ICD-10-CM | POA: Diagnosis not present

## 2016-01-27 DIAGNOSIS — Z1389 Encounter for screening for other disorder: Secondary | ICD-10-CM | POA: Diagnosis not present

## 2016-01-27 DIAGNOSIS — Z6827 Body mass index (BMI) 27.0-27.9, adult: Secondary | ICD-10-CM | POA: Diagnosis not present

## 2016-02-22 DIAGNOSIS — M545 Low back pain: Secondary | ICD-10-CM | POA: Diagnosis not present

## 2016-02-22 DIAGNOSIS — Z6828 Body mass index (BMI) 28.0-28.9, adult: Secondary | ICD-10-CM | POA: Diagnosis not present

## 2016-02-22 DIAGNOSIS — Z1389 Encounter for screening for other disorder: Secondary | ICD-10-CM | POA: Diagnosis not present

## 2016-02-24 ENCOUNTER — Other Ambulatory Visit: Payer: Self-pay | Admitting: Physician Assistant

## 2016-02-24 DIAGNOSIS — R5381 Other malaise: Secondary | ICD-10-CM

## 2016-03-04 ENCOUNTER — Other Ambulatory Visit: Payer: Self-pay | Admitting: Physician Assistant

## 2016-03-04 DIAGNOSIS — M810 Age-related osteoporosis without current pathological fracture: Secondary | ICD-10-CM

## 2016-03-08 ENCOUNTER — Other Ambulatory Visit: Payer: Commercial Managed Care - HMO

## 2016-03-17 ENCOUNTER — Inpatient Hospital Stay: Admission: RE | Admit: 2016-03-17 | Payer: Commercial Managed Care - HMO | Source: Ambulatory Visit

## 2016-03-30 ENCOUNTER — Ambulatory Visit
Admission: RE | Admit: 2016-03-30 | Discharge: 2016-03-30 | Disposition: A | Discharge status: Home / Self Care | Payer: Commercial Managed Care - HMO | Source: Ambulatory Visit | Attending: Physician Assistant | Admitting: Physician Assistant

## 2016-03-30 DIAGNOSIS — M810 Age-related osteoporosis without current pathological fracture: Secondary | ICD-10-CM | POA: Diagnosis not present

## 2016-03-30 DIAGNOSIS — Z780 Asymptomatic menopausal state: Secondary | ICD-10-CM | POA: Diagnosis not present

## 2016-05-03 DIAGNOSIS — E782 Mixed hyperlipidemia: Secondary | ICD-10-CM | POA: Diagnosis not present

## 2016-05-03 DIAGNOSIS — R0981 Nasal congestion: Secondary | ICD-10-CM | POA: Diagnosis not present

## 2016-05-03 DIAGNOSIS — H938X1 Other specified disorders of right ear: Secondary | ICD-10-CM | POA: Diagnosis not present

## 2016-05-03 DIAGNOSIS — Z6828 Body mass index (BMI) 28.0-28.9, adult: Secondary | ICD-10-CM | POA: Diagnosis not present

## 2016-05-03 DIAGNOSIS — I10 Essential (primary) hypertension: Secondary | ICD-10-CM | POA: Diagnosis not present

## 2016-05-20 ENCOUNTER — Other Ambulatory Visit: Payer: Self-pay | Admitting: Cardiovascular Disease

## 2016-05-20 NOTE — Telephone Encounter (Signed)
Rx request sent to pharmacy.  

## 2016-05-26 ENCOUNTER — Other Ambulatory Visit: Payer: Self-pay

## 2016-05-26 ENCOUNTER — Ambulatory Visit (INDEPENDENT_AMBULATORY_CARE_PROVIDER_SITE_OTHER): Payer: Commercial Managed Care - HMO | Admitting: Cardiovascular Disease

## 2016-05-26 VITALS — BP 132/66 | HR 60 | Ht 61.0 in | Wt 144.4 lb

## 2016-05-26 DIAGNOSIS — I2510 Atherosclerotic heart disease of native coronary artery without angina pectoris: Secondary | ICD-10-CM | POA: Diagnosis not present

## 2016-05-26 DIAGNOSIS — Z79899 Other long term (current) drug therapy: Secondary | ICD-10-CM | POA: Diagnosis not present

## 2016-05-26 DIAGNOSIS — I10 Essential (primary) hypertension: Secondary | ICD-10-CM

## 2016-05-26 DIAGNOSIS — E785 Hyperlipidemia, unspecified: Secondary | ICD-10-CM | POA: Diagnosis not present

## 2016-05-26 MED ORDER — LISINOPRIL 20 MG PO TABS: 20.0000 mg | ORAL_TABLET | Freq: Every day | ORAL | 3 refills | Status: DC

## 2016-05-26 MED ORDER — AMLODIPINE BESYLATE 10 MG PO TABS: 10.0000 mg | ORAL_TABLET | Freq: Every day | ORAL | 3 refills | Status: DC

## 2016-05-26 MED ORDER — ATORVASTATIN CALCIUM 20 MG PO TABS: 20.0000 mg | ORAL_TABLET | Freq: Every day | ORAL | 3 refills | Status: DC

## 2016-05-26 NOTE — Patient Instructions (Signed)
Medication Instructions: Dr Croitoru recommends that you continue on your current medications as directed. Please refer to the Current Medication list given to you today.  Labwork: Your physician recommends that you return for lab work at your earliest convenience - FASTING.   Testing/Procedures: NONE ORDERED  Follow-up: Dr Croitoru recommends that you schedule a follow-up appointment in 12 months. You will receive a reminder letter in the mail two months in advance. If you don't receive a letter, please call our office to schedule the follow-up appointment.  If you need a refill on your cardiac medications before your next appointment, please call your pharmacy. 

## 2016-05-26 NOTE — Progress Notes (Signed)
Cardiology Office Note    Date:  05/28/2016   ID:  Ann Patel, DOB Feb 26, 1946, MRN [id number]  PCP:  Collene Mares, PA-C  Cardiologist:   Sanda Klein, MD   Chief Complaint  Patient presents with  . Follow-up    History of Present Illness:  Ann Patel is a 70 y.o. female with coronary artery disease, mild aortic stenosis, hypertension and hyperlipidemia returning for follow-up. 11 years ago she had a drug-eluting stent placed in the mid right coronary artery and she has not had any coronary events or symptoms since that time. Her nuclear stress test shows a medium-size scar in the basal-mid inferior wall, without ischemia (2010). She has normal left ventricular systolic function.  She feels great and denies any complaints of chest pain or shortness of breath with activity. She walks 4-5 miles a day, 5 days a week and mows her own lawn. She is very engaged socially as well. She has not had problems with palpitations, dizziness, syncope, leg edema, claudication or focal neurological symptoms.  She has a murmur of aortic stenosis that radiates loudly to both carotids but did not have any significant carotid disease on previous ultrasound.    Past Medical History:  Diagnosis Date  . Coronary artery disease 09/2009   stress test EF 67%  . Hypercholesteremia   . Hypertension   . Myocardial infarction [facility])     Past Surgical History:  Procedure Laterality Date  . BREAST CYST EXCISION     fibro cyst removal  . CORONARY ANGIOPLASTY WITH STENT PLACEMENT Right 07/2005   receiving two consecutive drug eluting 2.5x50mm Taxus stents in the proximal to mid right coronary artery  . TONSILLECTOMY      Current Medications: Outpatient Medications Prior to Visit  Medication Sig Dispense Refill  . aspirin 81 MG tablet Take 81 mg by mouth daily.    . clopidogrel (PLAVIX) 75 MG tablet TAKE ONE (1) TABLET BY MOUTH EVERY DAY 90 tablet 3  . amLODipine (NORVASC) 10 MG tablet TAKE  ONE TABLET BY MOUTH EVERY DAY 90 tablet 3  . atorvastatin (LIPITOR) 20 MG tablet TAKE ONE (1) TABLET BY MOUTH EVERY DAY 90 tablet 3  . lisinopril (PRINIVIL,ZESTRIL) 20 MG tablet Take 1 tablet (20 mg total) by mouth daily. 90 tablet 3   No facility-administered medications prior to visit.      Allergies:   Review of patient's allergies indicates no known allergies.   Social History   Social History  . Marital status: Divorced    Spouse name: N/A  . Number of children: N/A  . Years of education: N/A   Social History Main Topics  . Smoking status: Never Smoker  . Smokeless tobacco: Never Used  . Alcohol use No  . Drug use: No  . Sexual activity: Not on file   Other Topics Concern  . Not on file   Social History Narrative  . No narrative on file     Family History:  The patient's family history includes Cancer (age of onset: 89) in her sister; Stroke (age of onset: 45) in her mother.   ROS:   Please see the history of present illness.    ROS All other systems reviewed and are negative.   PHYSICAL EXAM:   VS:  BP 132/66 (BP Location: Right Arm, Patient Position: Sitting, Cuff Size: Normal)   Pulse 60   Ht 5\' 1"  (1.549 m)   Wt 144 lb 6.4 oz (65.5 kg)  SpO2 97%   BMI 27.28 kg/m    GEN: Well nourished, well developed, in no acute distress  HEENT: normal  Neck: no JVD, Bilateral carotid bruits, no masses Cardiac: RRR; grade 2/6 early peaking systolic ejection murmur in the aortic focus, no diastolic murmurs, rubs, or gallops,no edema  Respiratory:  clear to auscultation bilaterally, normal work of breathing GI: soft, nontender, nondistended, + BS MS: no deformity or atrophy  Skin: warm and dry, no rash Neuro:  Alert and Oriented x 3, Strength and sensation are intact Psych: euthymic mood, full affect  Wt Readings from Last 3 Encounters:  05/26/16 144 lb 6.4 oz (65.5 kg)  05/06/15 147 lb 12.8 oz (67 kg)  04/25/14 147 lb 3.2 oz (66.8 kg)      Studies/Labs  Reviewed:   EKG:  EKG is ordered today.  The ekg ordered today demonstrates Almost sinus rhythm, normal tracing, QTC 414 ms  Recent Labs: No results found for requested labs within last 8760 hours.   Lipid Panel    Component Value Date/Time   CHOL 144 05/07/2015 0823   TRIG 68 05/07/2015 0823   HDL 49 05/07/2015 0823   CHOLHDL 2.6 04/29/2014 0853   VLDL 13 04/29/2014 0853   LDLCALC 81 05/07/2015 0823     ASSESSMENT:    1. Coronary artery disease involving native coronary artery of native heart without angina pectoris   2. Dyslipidemia   3. Essential hypertension   4. Medication management      PLAN:  In order of problems listed above:  1. CAD: Remote stent to RCA, small inferior wall scar with normal left ventricular systolic function, asymptomatic with a very active lifestyle. 2. HLP: Time to recheck a lipid profile 3. HTN: Excellent blood pressure control on current medications.    Medication Adjustments/Labs and Tests Ordered: Current medicines are reviewed at length with the patient today.  Concerns regarding medicines are outlined above.  Medication changes, Labs and Tests ordered today are listed in the Patient Instructions below. Patient Instructions  Medication Instructions: Dr Sallyanne Kuster recommends that you continue on your current medications as directed. Please refer to the Current Medication list given to you today.  Labwork: Your physician recommends that you return for lab work at your earliest [place].  Testing/Procedures: NONE ORDERED  Follow-up: Dr Sallyanne Kuster recommends that you schedule a follow-up appointment in 12 months. You will receive a reminder letter in the mail two months in advance. If you don't receive a letter, please call our office to schedule the follow-up appointment.  If you need a refill on your cardiac medications before your next appointment, please call your pharmacy.    Signed, Sanda Klein, MD  05/28/2016  3:41 PM    [facility] [place], [place], [place]  [address] Phone: [phone]; Fax: [phone]

## 2016-05-27 ENCOUNTER — Other Ambulatory Visit: Payer: Self-pay | Admitting: Cardiovascular Disease

## 2016-05-27 DIAGNOSIS — Z79899 Other long term (current) drug therapy: Secondary | ICD-10-CM | POA: Diagnosis not present

## 2016-05-27 DIAGNOSIS — E785 Hyperlipidemia, unspecified: Secondary | ICD-10-CM | POA: Diagnosis not present

## 2016-05-27 LAB — COMPREHENSIVE METABOLIC PANEL
ALBUMIN: 4.2 g/dL (ref 3.5–4.8)
ALT: 14 IU/L (ref 0–32)
AST: 19 IU/L (ref 0–40)
Albumin/Globulin Ratio: 1.7 (ref 1.2–2.2)
Alkaline Phosphatase: 84 IU/L (ref 39–117)
BILIRUBIN TOTAL: 0.4 mg/dL (ref 0.0–1.2)
BUN / CREAT RATIO: 13 (ref 12–28)
BUN: 10 mg/dL (ref 8–27)
CALCIUM: 9 mg/dL (ref 8.7–10.3)
CHLORIDE: 109 mmol/L — AB (ref 96–106)
CO2: 27 mmol/L (ref 18–29)
CREATININE: 0.8 mg/dL (ref 0.57–1.00)
GFR, EST AFRICAN AMERICAN: 86 mL/min/{1.73_m2} (ref >59)
GFR, EST NON AFRICAN AMERICAN: 75 mL/min/{1.73_m2} (ref >59)
GLUCOSE: 94 mg/dL (ref 65–99)
Globulin, Total: 2.5 g/dL (ref 1.5–4.5)
Potassium: 3.7 mmol/L (ref 3.5–5.2)
Sodium: 143 mmol/L (ref 134–144)
TOTAL PROTEIN: 6.7 g/dL (ref 6.0–8.5)

## 2016-05-27 LAB — LIPID PANEL W/O CHOL/HDL RATIO
Cholesterol, Total: 134 mg/dL (ref 100–199)
HDL: 64 mg/dL (ref >39)
LDL CALC: 61 mg/dL (ref 0–99)
Triglycerides: 43 mg/dL (ref 0–149)
VLDL CHOLESTEROL CAL: 9 mg/dL (ref 5–40)

## 2016-05-28 ENCOUNTER — Encounter: Payer: Self-pay | Admitting: Cardiovascular Disease

## 2016-06-24 DIAGNOSIS — I10 Essential (primary) hypertension: Secondary | ICD-10-CM | POA: Diagnosis not present

## 2016-06-24 DIAGNOSIS — H8111 Benign paroxysmal vertigo, right ear: Secondary | ICD-10-CM | POA: Diagnosis not present

## 2016-06-24 DIAGNOSIS — E782 Mixed hyperlipidemia: Secondary | ICD-10-CM | POA: Diagnosis not present

## 2016-06-24 DIAGNOSIS — E663 Overweight: Secondary | ICD-10-CM | POA: Diagnosis not present

## 2016-06-24 DIAGNOSIS — Z6828 Body mass index (BMI) 28.0-28.9, adult: Secondary | ICD-10-CM | POA: Diagnosis not present

## 2016-07-08 ENCOUNTER — Other Ambulatory Visit: Payer: Self-pay | Admitting: Cardiovascular Disease

## 2016-09-09 ENCOUNTER — Other Ambulatory Visit (HOSPITAL_COMMUNITY): Payer: Self-pay | Admitting: Internal Medicine

## 2016-09-09 DIAGNOSIS — Z1231 Encounter for screening mammogram for malignant neoplasm of breast: Secondary | ICD-10-CM

## 2016-09-21 ENCOUNTER — Ambulatory Visit (HOSPITAL_COMMUNITY)
Admission: RE | Admit: 2016-09-21 | Discharge: 2016-09-21 | Disposition: A | Discharge status: Home / Self Care | Payer: Commercial Managed Care - HMO | Source: Ambulatory Visit | Attending: Internal Medicine | Admitting: Internal Medicine

## 2016-09-21 DIAGNOSIS — Z1231 Encounter for screening mammogram for malignant neoplasm of breast: Secondary | ICD-10-CM | POA: Diagnosis not present

## 2016-12-06 DIAGNOSIS — Z6828 Body mass index (BMI) 28.0-28.9, adult: Secondary | ICD-10-CM | POA: Diagnosis not present

## 2016-12-06 DIAGNOSIS — M1991 Primary osteoarthritis, unspecified site: Secondary | ICD-10-CM | POA: Diagnosis not present

## 2016-12-06 DIAGNOSIS — Z1389 Encounter for screening for other disorder: Secondary | ICD-10-CM | POA: Diagnosis not present

## 2016-12-06 DIAGNOSIS — M75102 Unspecified rotator cuff tear or rupture of left shoulder, not specified as traumatic: Secondary | ICD-10-CM | POA: Diagnosis not present

## 2017-03-02 DIAGNOSIS — I10 Essential (primary) hypertension: Secondary | ICD-10-CM | POA: Diagnosis not present

## 2017-03-02 DIAGNOSIS — S59092A Other physeal fracture of lower end of ulna, left arm, initial encounter for closed fracture: Secondary | ICD-10-CM | POA: Diagnosis not present

## 2017-03-02 DIAGNOSIS — S2241XK Multiple fractures of ribs, right side, subsequent encounter for fracture with nonunion: Secondary | ICD-10-CM | POA: Diagnosis not present

## 2017-03-02 DIAGNOSIS — M25461 Effusion, right knee: Secondary | ICD-10-CM | POA: Diagnosis not present

## 2017-03-02 DIAGNOSIS — S72002A Fracture of unspecified part of neck of left femur, initial encounter for closed fracture: Secondary | ICD-10-CM | POA: Diagnosis not present

## 2017-03-02 DIAGNOSIS — M799 Soft tissue disorder, unspecified: Secondary | ICD-10-CM | POA: Diagnosis not present

## 2017-03-02 DIAGNOSIS — R531 Weakness: Secondary | ICD-10-CM | POA: Diagnosis not present

## 2017-03-02 DIAGNOSIS — I340 Nonrheumatic mitral (valve) insufficiency: Secondary | ICD-10-CM | POA: Diagnosis not present

## 2017-03-02 DIAGNOSIS — S0190XA Unspecified open wound of unspecified part of head, initial encounter: Secondary | ICD-10-CM | POA: Diagnosis not present

## 2017-03-02 DIAGNOSIS — Z955 Presence of coronary angioplasty implant and graft: Secondary | ICD-10-CM | POA: Diagnosis not present

## 2017-03-02 DIAGNOSIS — T1490XA Injury, unspecified, initial encounter: Secondary | ICD-10-CM | POA: Diagnosis not present

## 2017-03-02 DIAGNOSIS — M25832 Other specified joint disorders, left wrist: Secondary | ICD-10-CM | POA: Diagnosis not present

## 2017-03-02 DIAGNOSIS — S82102D Unspecified fracture of upper end of left tibia, subsequent encounter for closed fracture with routine healing: Secondary | ICD-10-CM | POA: Diagnosis not present

## 2017-03-02 DIAGNOSIS — S0181XA Laceration without foreign body of other part of head, initial encounter: Secondary | ICD-10-CM | POA: Diagnosis not present

## 2017-03-02 DIAGNOSIS — S52502A Unspecified fracture of the lower end of left radius, initial encounter for closed fracture: Secondary | ICD-10-CM | POA: Diagnosis not present

## 2017-03-02 DIAGNOSIS — S82101D Unspecified fracture of upper end of right tibia, subsequent encounter for closed fracture with routine healing: Secondary | ICD-10-CM | POA: Diagnosis not present

## 2017-03-02 DIAGNOSIS — Z041 Encounter for examination and observation following transport accident: Secondary | ICD-10-CM | POA: Diagnosis not present

## 2017-03-02 DIAGNOSIS — R0600 Dyspnea, unspecified: Secondary | ICD-10-CM | POA: Diagnosis not present

## 2017-03-02 DIAGNOSIS — S51811A Laceration without foreign body of right forearm, initial encounter: Secondary | ICD-10-CM | POA: Diagnosis not present

## 2017-03-02 DIAGNOSIS — M1612 Unilateral primary osteoarthritis, left hip: Secondary | ICD-10-CM | POA: Diagnosis not present

## 2017-03-02 DIAGNOSIS — S82202A Unspecified fracture of shaft of left tibia, initial encounter for closed fracture: Secondary | ICD-10-CM | POA: Diagnosis not present

## 2017-03-02 DIAGNOSIS — I491 Atrial premature depolarization: Secondary | ICD-10-CM | POA: Diagnosis not present

## 2017-03-02 DIAGNOSIS — I252 Old myocardial infarction: Secondary | ICD-10-CM | POA: Diagnosis not present

## 2017-03-02 DIAGNOSIS — E872 Acidosis: Secondary | ICD-10-CM | POA: Diagnosis not present

## 2017-03-02 DIAGNOSIS — S59901A Unspecified injury of right elbow, initial encounter: Secondary | ICD-10-CM | POA: Diagnosis not present

## 2017-03-02 DIAGNOSIS — J986 Disorders of diaphragm: Secondary | ICD-10-CM | POA: Diagnosis not present

## 2017-03-02 DIAGNOSIS — S72392A Other fracture of shaft of left femur, initial encounter for closed fracture: Secondary | ICD-10-CM | POA: Diagnosis not present

## 2017-03-02 DIAGNOSIS — S52602A Unspecified fracture of lower end of left ulna, initial encounter for closed fracture: Secondary | ICD-10-CM | POA: Diagnosis not present

## 2017-03-02 DIAGNOSIS — R000 Tachycardia, unspecified: Secondary | ICD-10-CM | POA: Diagnosis not present

## 2017-03-02 DIAGNOSIS — D259 Leiomyoma of uterus, unspecified: Secondary | ICD-10-CM | POA: Diagnosis not present

## 2017-03-02 DIAGNOSIS — M533 Sacrococcygeal disorders, not elsewhere classified: Secondary | ICD-10-CM | POA: Diagnosis not present

## 2017-03-02 DIAGNOSIS — R0689 Other abnormalities of breathing: Secondary | ICD-10-CM | POA: Diagnosis not present

## 2017-03-02 DIAGNOSIS — S82232A Displaced oblique fracture of shaft of left tibia, initial encounter for closed fracture: Secondary | ICD-10-CM | POA: Diagnosis not present

## 2017-03-02 DIAGNOSIS — I517 Cardiomegaly: Secondary | ICD-10-CM | POA: Diagnosis not present

## 2017-03-02 DIAGNOSIS — S52572D Other intraarticular fracture of lower end of left radius, subsequent encounter for closed fracture with routine healing: Secondary | ICD-10-CM | POA: Diagnosis not present

## 2017-03-02 DIAGNOSIS — S80852A Superficial foreign body, left lower leg, initial encounter: Secondary | ICD-10-CM | POA: Diagnosis not present

## 2017-03-02 DIAGNOSIS — M4306 Spondylolysis, lumbar region: Secondary | ICD-10-CM | POA: Diagnosis not present

## 2017-03-02 DIAGNOSIS — I2510 Atherosclerotic heart disease of native coronary artery without angina pectoris: Secondary | ICD-10-CM | POA: Diagnosis not present

## 2017-03-02 DIAGNOSIS — M180 Bilateral primary osteoarthritis of first carpometacarpal joints: Secondary | ICD-10-CM | POA: Diagnosis not present

## 2017-03-02 DIAGNOSIS — J939 Pneumothorax, unspecified: Secondary | ICD-10-CM | POA: Diagnosis not present

## 2017-03-02 DIAGNOSIS — M8580 Other specified disorders of bone density and structure, unspecified site: Secondary | ICD-10-CM | POA: Diagnosis not present

## 2017-03-02 DIAGNOSIS — M898X2 Other specified disorders of bone, upper arm: Secondary | ICD-10-CM | POA: Diagnosis not present

## 2017-03-02 DIAGNOSIS — S52692A Other fracture of lower end of left ulna, initial encounter for closed fracture: Secondary | ICD-10-CM | POA: Diagnosis not present

## 2017-03-02 DIAGNOSIS — I998 Other disorder of circulatory system: Secondary | ICD-10-CM | POA: Diagnosis not present

## 2017-03-02 DIAGNOSIS — M19071 Primary osteoarthritis, right ankle and foot: Secondary | ICD-10-CM | POA: Diagnosis not present

## 2017-03-02 DIAGNOSIS — S52592A Other fractures of lower end of left radius, initial encounter for closed fracture: Secondary | ICD-10-CM | POA: Diagnosis not present

## 2017-03-02 DIAGNOSIS — S82401D Unspecified fracture of shaft of right fibula, subsequent encounter for closed fracture with routine healing: Secondary | ICD-10-CM | POA: Diagnosis not present

## 2017-03-02 DIAGNOSIS — M19022 Primary osteoarthritis, left elbow: Secondary | ICD-10-CM | POA: Diagnosis not present

## 2017-03-02 DIAGNOSIS — R54 Age-related physical debility: Secondary | ICD-10-CM | POA: Diagnosis not present

## 2017-03-02 DIAGNOSIS — S27321A Contusion of lung, unilateral, initial encounter: Secondary | ICD-10-CM | POA: Diagnosis not present

## 2017-03-02 DIAGNOSIS — M7751 Other enthesopathy of right foot: Secondary | ICD-10-CM | POA: Diagnosis not present

## 2017-03-02 DIAGNOSIS — Z4689 Encounter for fitting and adjustment of other specified devices: Secondary | ICD-10-CM | POA: Diagnosis not present

## 2017-03-02 DIAGNOSIS — M25062 Hemarthrosis, left knee: Secondary | ICD-10-CM | POA: Diagnosis not present

## 2017-03-02 DIAGNOSIS — M1711 Unilateral primary osteoarthritis, right knee: Secondary | ICD-10-CM | POA: Diagnosis not present

## 2017-03-02 DIAGNOSIS — R188 Other ascites: Secondary | ICD-10-CM | POA: Diagnosis not present

## 2017-03-02 DIAGNOSIS — S82454A Nondisplaced comminuted fracture of shaft of right fibula, initial encounter for closed fracture: Secondary | ICD-10-CM | POA: Diagnosis not present

## 2017-03-02 DIAGNOSIS — S82252A Displaced comminuted fracture of shaft of left tibia, initial encounter for closed fracture: Secondary | ICD-10-CM | POA: Diagnosis not present

## 2017-03-02 DIAGNOSIS — S098XXA Other specified injuries of head, initial encounter: Secondary | ICD-10-CM | POA: Diagnosis not present

## 2017-03-02 DIAGNOSIS — M160 Bilateral primary osteoarthritis of hip: Secondary | ICD-10-CM | POA: Diagnosis not present

## 2017-03-02 DIAGNOSIS — S82831A Other fracture of upper and lower end of right fibula, initial encounter for closed fracture: Secondary | ICD-10-CM | POA: Diagnosis not present

## 2017-03-02 DIAGNOSIS — S51011A Laceration without foreign body of right elbow, initial encounter: Secondary | ICD-10-CM | POA: Diagnosis not present

## 2017-03-02 DIAGNOSIS — T1490XD Injury, unspecified, subsequent encounter: Secondary | ICD-10-CM | POA: Diagnosis not present

## 2017-03-02 DIAGNOSIS — Z7409 Other reduced mobility: Secondary | ICD-10-CM | POA: Diagnosis not present

## 2017-03-02 DIAGNOSIS — Z9889 Other specified postprocedural states: Secondary | ICD-10-CM | POA: Diagnosis not present

## 2017-03-02 DIAGNOSIS — S52102D Unspecified fracture of upper end of left radius, subsequent encounter for closed fracture with routine healing: Secondary | ICD-10-CM | POA: Diagnosis not present

## 2017-03-02 DIAGNOSIS — S52612D Displaced fracture of left ulna styloid process, subsequent encounter for closed fracture with routine healing: Secondary | ICD-10-CM | POA: Diagnosis not present

## 2017-03-02 DIAGNOSIS — R4182 Altered mental status, unspecified: Secondary | ICD-10-CM | POA: Diagnosis not present

## 2017-03-02 DIAGNOSIS — M76891 Other specified enthesopathies of right lower limb, excluding foot: Secondary | ICD-10-CM | POA: Diagnosis not present

## 2017-03-02 DIAGNOSIS — R2232 Localized swelling, mass and lump, left upper limb: Secondary | ICD-10-CM | POA: Diagnosis not present

## 2017-03-02 DIAGNOSIS — M7752 Other enthesopathy of left foot: Secondary | ICD-10-CM | POA: Diagnosis not present

## 2017-03-02 DIAGNOSIS — R918 Other nonspecific abnormal finding of lung field: Secondary | ICD-10-CM | POA: Diagnosis not present

## 2017-03-02 DIAGNOSIS — M19041 Primary osteoarthritis, right hand: Secondary | ICD-10-CM | POA: Diagnosis not present

## 2017-03-02 DIAGNOSIS — M1812 Unilateral primary osteoarthritis of first carpometacarpal joint, left hand: Secondary | ICD-10-CM | POA: Diagnosis not present

## 2017-03-02 DIAGNOSIS — S41111A Laceration without foreign body of right upper arm, initial encounter: Secondary | ICD-10-CM | POA: Diagnosis not present

## 2017-03-02 DIAGNOSIS — S270XXA Traumatic pneumothorax, initial encounter: Secondary | ICD-10-CM | POA: Diagnosis not present

## 2017-03-02 DIAGNOSIS — M24159 Other articular cartilage disorders, unspecified hip: Secondary | ICD-10-CM | POA: Diagnosis not present

## 2017-03-02 DIAGNOSIS — Z743 Need for continuous supervision: Secondary | ICD-10-CM | POA: Diagnosis not present

## 2017-03-02 DIAGNOSIS — M19042 Primary osteoarthritis, left hand: Secondary | ICD-10-CM | POA: Diagnosis not present

## 2017-03-02 DIAGNOSIS — I110 Hypertensive heart disease with heart failure: Secondary | ICD-10-CM | POA: Diagnosis not present

## 2017-03-02 DIAGNOSIS — S82242A Displaced spiral fracture of shaft of left tibia, initial encounter for closed fracture: Secondary | ICD-10-CM | POA: Diagnosis not present

## 2017-03-02 DIAGNOSIS — S89291A Other physeal fracture of upper end of right fibula, initial encounter for closed fracture: Secondary | ICD-10-CM | POA: Diagnosis not present

## 2017-03-02 DIAGNOSIS — S82292A Other fracture of shaft of left tibia, initial encounter for closed fracture: Secondary | ICD-10-CM | POA: Diagnosis not present

## 2017-03-02 DIAGNOSIS — I119 Hypertensive heart disease without heart failure: Secondary | ICD-10-CM | POA: Diagnosis not present

## 2017-03-02 DIAGNOSIS — S82102A Unspecified fracture of upper end of left tibia, initial encounter for closed fracture: Secondary | ICD-10-CM | POA: Diagnosis not present

## 2017-03-02 DIAGNOSIS — S42111A Displaced fracture of body of scapula, right shoulder, initial encounter for closed fracture: Secondary | ICD-10-CM | POA: Diagnosis not present

## 2017-03-02 DIAGNOSIS — G8911 Acute pain due to trauma: Secondary | ICD-10-CM | POA: Diagnosis not present

## 2017-03-02 DIAGNOSIS — E441 Mild protein-calorie malnutrition: Secondary | ICD-10-CM | POA: Diagnosis not present

## 2017-03-02 DIAGNOSIS — D62 Acute posthemorrhagic anemia: Secondary | ICD-10-CM | POA: Diagnosis not present

## 2017-03-02 DIAGNOSIS — S2241XA Multiple fractures of ribs, right side, initial encounter for closed fracture: Secondary | ICD-10-CM | POA: Diagnosis not present

## 2017-03-02 DIAGNOSIS — S2241XD Multiple fractures of ribs, right side, subsequent encounter for fracture with routine healing: Secondary | ICD-10-CM | POA: Diagnosis not present

## 2017-03-02 DIAGNOSIS — Y998 Other external cause status: Secondary | ICD-10-CM | POA: Diagnosis not present

## 2017-03-02 DIAGNOSIS — S79921A Unspecified injury of right thigh, initial encounter: Secondary | ICD-10-CM | POA: Diagnosis not present

## 2017-03-02 DIAGNOSIS — R404 Transient alteration of awareness: Secondary | ICD-10-CM | POA: Diagnosis not present

## 2017-03-02 DIAGNOSIS — S52572A Other intraarticular fracture of lower end of left radius, initial encounter for closed fracture: Secondary | ICD-10-CM | POA: Diagnosis not present

## 2017-03-02 DIAGNOSIS — E785 Hyperlipidemia, unspecified: Secondary | ICD-10-CM | POA: Diagnosis not present

## 2017-03-02 DIAGNOSIS — J9811 Atelectasis: Secondary | ICD-10-CM | POA: Diagnosis not present

## 2017-03-02 DIAGNOSIS — S82142A Displaced bicondylar fracture of left tibia, initial encounter for closed fracture: Secondary | ICD-10-CM | POA: Diagnosis not present

## 2017-03-02 DIAGNOSIS — S82192A Other fracture of upper end of left tibia, initial encounter for closed fracture: Secondary | ICD-10-CM | POA: Diagnosis not present

## 2017-03-02 DIAGNOSIS — S0990XA Unspecified injury of head, initial encounter: Secondary | ICD-10-CM | POA: Diagnosis not present

## 2017-03-23 DIAGNOSIS — S52602D Unspecified fracture of lower end of left ulna, subsequent encounter for closed fracture with routine healing: Secondary | ICD-10-CM | POA: Diagnosis not present

## 2017-03-23 DIAGNOSIS — I2510 Atherosclerotic heart disease of native coronary artery without angina pectoris: Secondary | ICD-10-CM | POA: Diagnosis not present

## 2017-03-23 DIAGNOSIS — Z7409 Other reduced mobility: Secondary | ICD-10-CM | POA: Diagnosis not present

## 2017-03-23 DIAGNOSIS — S2241XA Multiple fractures of ribs, right side, initial encounter for closed fracture: Secondary | ICD-10-CM | POA: Diagnosis not present

## 2017-03-23 DIAGNOSIS — S2241XD Multiple fractures of ribs, right side, subsequent encounter for fracture with routine healing: Secondary | ICD-10-CM | POA: Diagnosis not present

## 2017-03-23 DIAGNOSIS — S0181XA Laceration without foreign body of other part of head, initial encounter: Secondary | ICD-10-CM | POA: Diagnosis not present

## 2017-03-23 DIAGNOSIS — R531 Weakness: Secondary | ICD-10-CM | POA: Diagnosis not present

## 2017-03-23 DIAGNOSIS — I10 Essential (primary) hypertension: Secondary | ICD-10-CM | POA: Diagnosis not present

## 2017-03-23 DIAGNOSIS — S52692D Other fracture of lower end of left ulna, subsequent encounter for closed fracture with routine healing: Secondary | ICD-10-CM | POA: Diagnosis not present

## 2017-03-23 DIAGNOSIS — S82401D Unspecified fracture of shaft of right fibula, subsequent encounter for closed fracture with routine healing: Secondary | ICD-10-CM | POA: Diagnosis not present

## 2017-03-23 DIAGNOSIS — S52572D Other intraarticular fracture of lower end of left radius, subsequent encounter for closed fracture with routine healing: Secondary | ICD-10-CM | POA: Diagnosis not present

## 2017-03-23 DIAGNOSIS — S2241XK Multiple fractures of ribs, right side, subsequent encounter for fracture with nonunion: Secondary | ICD-10-CM | POA: Diagnosis not present

## 2017-03-23 DIAGNOSIS — F419 Anxiety disorder, unspecified: Secondary | ICD-10-CM | POA: Diagnosis not present

## 2017-03-23 DIAGNOSIS — J939 Pneumothorax, unspecified: Secondary | ICD-10-CM | POA: Diagnosis not present

## 2017-03-23 DIAGNOSIS — T1490XD Injury, unspecified, subsequent encounter: Secondary | ICD-10-CM | POA: Diagnosis not present

## 2017-03-23 DIAGNOSIS — S52502A Unspecified fracture of the lower end of left radius, initial encounter for closed fracture: Secondary | ICD-10-CM | POA: Diagnosis not present

## 2017-03-23 DIAGNOSIS — Z658 Other specified problems related to psychosocial circumstances: Secondary | ICD-10-CM | POA: Diagnosis not present

## 2017-03-23 DIAGNOSIS — S52602A Unspecified fracture of lower end of left ulna, initial encounter for closed fracture: Secondary | ICD-10-CM | POA: Diagnosis not present

## 2017-03-23 DIAGNOSIS — S51811A Laceration without foreign body of right forearm, initial encounter: Secondary | ICD-10-CM | POA: Diagnosis not present

## 2017-03-23 DIAGNOSIS — I119 Hypertensive heart disease without heart failure: Secondary | ICD-10-CM | POA: Diagnosis not present

## 2017-03-23 DIAGNOSIS — E785 Hyperlipidemia, unspecified: Secondary | ICD-10-CM | POA: Diagnosis not present

## 2017-03-23 DIAGNOSIS — S82101D Unspecified fracture of upper end of right tibia, subsequent encounter for closed fracture with routine healing: Secondary | ICD-10-CM | POA: Diagnosis not present

## 2017-03-23 DIAGNOSIS — S82102D Unspecified fracture of upper end of left tibia, subsequent encounter for closed fracture with routine healing: Secondary | ICD-10-CM | POA: Diagnosis not present

## 2017-03-23 DIAGNOSIS — S62172D Displaced fracture of trapezium [larger multangular], left wrist, subsequent encounter for fracture with routine healing: Secondary | ICD-10-CM | POA: Diagnosis not present

## 2017-03-23 DIAGNOSIS — S82102A Unspecified fracture of upper end of left tibia, initial encounter for closed fracture: Secondary | ICD-10-CM | POA: Diagnosis not present

## 2017-03-23 DIAGNOSIS — S52102D Unspecified fracture of upper end of left radius, subsequent encounter for closed fracture with routine healing: Secondary | ICD-10-CM | POA: Diagnosis not present

## 2017-03-23 DIAGNOSIS — S52502D Unspecified fracture of the lower end of left radius, subsequent encounter for closed fracture with routine healing: Secondary | ICD-10-CM | POA: Diagnosis not present

## 2017-03-23 DIAGNOSIS — Z7189 Other specified counseling: Secondary | ICD-10-CM | POA: Diagnosis not present

## 2017-03-23 DIAGNOSIS — S82242D Displaced spiral fracture of shaft of left tibia, subsequent encounter for closed fracture with routine healing: Secondary | ICD-10-CM | POA: Diagnosis not present

## 2017-03-23 DIAGNOSIS — Z4789 Encounter for other orthopedic aftercare: Secondary | ICD-10-CM | POA: Diagnosis not present

## 2017-03-23 DIAGNOSIS — S82192D Other fracture of upper end of left tibia, subsequent encounter for closed fracture with routine healing: Secondary | ICD-10-CM | POA: Diagnosis not present

## 2017-03-27 DIAGNOSIS — S2241XD Multiple fractures of ribs, right side, subsequent encounter for fracture with routine healing: Secondary | ICD-10-CM | POA: Diagnosis not present

## 2017-03-27 DIAGNOSIS — S82102D Unspecified fracture of upper end of left tibia, subsequent encounter for closed fracture with routine healing: Secondary | ICD-10-CM | POA: Diagnosis not present

## 2017-03-27 DIAGNOSIS — S52502D Unspecified fracture of the lower end of left radius, subsequent encounter for closed fracture with routine healing: Secondary | ICD-10-CM | POA: Diagnosis not present

## 2017-03-27 DIAGNOSIS — I10 Essential (primary) hypertension: Secondary | ICD-10-CM | POA: Diagnosis not present

## 2017-03-28 DIAGNOSIS — S52502D Unspecified fracture of the lower end of left radius, subsequent encounter for closed fracture with routine healing: Secondary | ICD-10-CM | POA: Diagnosis not present

## 2017-03-28 DIAGNOSIS — I2510 Atherosclerotic heart disease of native coronary artery without angina pectoris: Secondary | ICD-10-CM | POA: Diagnosis not present

## 2017-03-28 DIAGNOSIS — S82102D Unspecified fracture of upper end of left tibia, subsequent encounter for closed fracture with routine healing: Secondary | ICD-10-CM | POA: Diagnosis not present

## 2017-03-28 DIAGNOSIS — I10 Essential (primary) hypertension: Secondary | ICD-10-CM | POA: Diagnosis not present

## 2017-03-30 DIAGNOSIS — Z7189 Other specified counseling: Secondary | ICD-10-CM | POA: Diagnosis not present

## 2017-04-04 DIAGNOSIS — S52502D Unspecified fracture of the lower end of left radius, subsequent encounter for closed fracture with routine healing: Secondary | ICD-10-CM | POA: Diagnosis not present

## 2017-04-04 DIAGNOSIS — S2241XD Multiple fractures of ribs, right side, subsequent encounter for fracture with routine healing: Secondary | ICD-10-CM | POA: Diagnosis not present

## 2017-04-04 DIAGNOSIS — S82102D Unspecified fracture of upper end of left tibia, subsequent encounter for closed fracture with routine healing: Secondary | ICD-10-CM | POA: Diagnosis not present

## 2017-04-11 DIAGNOSIS — E785 Hyperlipidemia, unspecified: Secondary | ICD-10-CM | POA: Diagnosis not present

## 2017-04-11 DIAGNOSIS — I10 Essential (primary) hypertension: Secondary | ICD-10-CM | POA: Diagnosis not present

## 2017-04-11 DIAGNOSIS — S82102D Unspecified fracture of upper end of left tibia, subsequent encounter for closed fracture with routine healing: Secondary | ICD-10-CM | POA: Diagnosis not present

## 2017-04-11 DIAGNOSIS — J939 Pneumothorax, unspecified: Secondary | ICD-10-CM | POA: Diagnosis not present

## 2017-04-12 DIAGNOSIS — Z4789 Encounter for other orthopedic aftercare: Secondary | ICD-10-CM | POA: Diagnosis not present

## 2017-04-12 DIAGNOSIS — S52502D Unspecified fracture of the lower end of left radius, subsequent encounter for closed fracture with routine healing: Secondary | ICD-10-CM | POA: Diagnosis not present

## 2017-04-12 DIAGNOSIS — S52692D Other fracture of lower end of left ulna, subsequent encounter for closed fracture with routine healing: Secondary | ICD-10-CM | POA: Diagnosis not present

## 2017-04-12 DIAGNOSIS — S82192D Other fracture of upper end of left tibia, subsequent encounter for closed fracture with routine healing: Secondary | ICD-10-CM | POA: Diagnosis not present

## 2017-04-12 DIAGNOSIS — S52572D Other intraarticular fracture of lower end of left radius, subsequent encounter for closed fracture with routine healing: Secondary | ICD-10-CM | POA: Diagnosis not present

## 2017-04-12 DIAGNOSIS — S82102D Unspecified fracture of upper end of left tibia, subsequent encounter for closed fracture with routine healing: Secondary | ICD-10-CM | POA: Diagnosis not present

## 2017-04-12 DIAGNOSIS — S82242D Displaced spiral fracture of shaft of left tibia, subsequent encounter for closed fracture with routine healing: Secondary | ICD-10-CM | POA: Diagnosis not present

## 2017-04-12 DIAGNOSIS — S52602D Unspecified fracture of lower end of left ulna, subsequent encounter for closed fracture with routine healing: Secondary | ICD-10-CM | POA: Diagnosis not present

## 2017-04-14 ENCOUNTER — Other Ambulatory Visit: Payer: Self-pay

## 2017-04-14 DIAGNOSIS — S82102D Unspecified fracture of upper end of left tibia, subsequent encounter for closed fracture with routine healing: Secondary | ICD-10-CM | POA: Diagnosis not present

## 2017-04-14 DIAGNOSIS — S52502D Unspecified fracture of the lower end of left radius, subsequent encounter for closed fracture with routine healing: Secondary | ICD-10-CM | POA: Diagnosis not present

## 2017-04-14 DIAGNOSIS — S82101D Unspecified fracture of upper end of right tibia, subsequent encounter for closed fracture with routine healing: Secondary | ICD-10-CM | POA: Diagnosis not present

## 2017-04-14 DIAGNOSIS — S52602D Unspecified fracture of lower end of left ulna, subsequent encounter for closed fracture with routine healing: Secondary | ICD-10-CM | POA: Diagnosis not present

## 2017-04-14 DIAGNOSIS — I2510 Atherosclerotic heart disease of native coronary artery without angina pectoris: Secondary | ICD-10-CM | POA: Diagnosis not present

## 2017-04-14 DIAGNOSIS — I10 Essential (primary) hypertension: Secondary | ICD-10-CM | POA: Diagnosis not present

## 2017-04-14 DIAGNOSIS — S2241XD Multiple fractures of ribs, right side, subsequent encounter for fracture with routine healing: Secondary | ICD-10-CM | POA: Diagnosis not present

## 2017-04-14 DIAGNOSIS — S82401D Unspecified fracture of shaft of right fibula, subsequent encounter for closed fracture with routine healing: Secondary | ICD-10-CM | POA: Diagnosis not present

## 2017-04-14 NOTE — Patient Outreach (Signed)
[facility]) Care Management  04/14/2017  Ann Patel Jul 23, 1946 [id number]   Transition of care  Week # 1 Referral date: 04/14/17 Referral source:  Status post hospital discharge on 04/13/17 from  [place] [facility] due to status post ORIF of left tibia/ knee Program  Telephone call to patient regarding transition of care follow up. Unable to reach patient or leave voice mail message. Message states, the person you are trying to reach doe not have a voice mail box set up.   PLAN:RNCM will attempt 2nd telephone outreach to patient within 3 business days.  Quinn Plowman RN,BSN,CCM [facility] Telephonic  [phone]

## 2017-04-15 DIAGNOSIS — S62172D Displaced fracture of trapezium [larger multangular], left wrist, subsequent encounter for fracture with routine healing: Secondary | ICD-10-CM | POA: Diagnosis not present

## 2017-04-17 ENCOUNTER — Ambulatory Visit: Payer: Self-pay

## 2017-04-17 ENCOUNTER — Other Ambulatory Visit: Payer: Self-pay

## 2017-04-17 NOTE — Patient Outreach (Signed)
[facility]) Care Management  04/17/2017  Ann Patel Dec 26, 1945 [id number]  Transition of care  Week # 1 Referral date: 04/14/17 Referral source:  Status post hospital discharge on 04/13/17 from  [place] [facility] due to status post ORIF of left tibia/ knee Attempt #2   Second Telephone call to patient regarding transition of care follow up. Unable to reach patient or leave voice mail message. Message states, the person you are trying to reach doe not have a voice mail box set up.  PLAN:  RNCM will attempt 2nd telephone call to patient within 3 business days.   Quinn Plowman RN,BSN,CCM [facility] Telephonic  [phone]

## 2017-04-18 ENCOUNTER — Other Ambulatory Visit: Payer: Self-pay

## 2017-04-18 DIAGNOSIS — S52602D Unspecified fracture of lower end of left ulna, subsequent encounter for closed fracture with routine healing: Secondary | ICD-10-CM | POA: Diagnosis not present

## 2017-04-18 DIAGNOSIS — S52502D Unspecified fracture of the lower end of left radius, subsequent encounter for closed fracture with routine healing: Secondary | ICD-10-CM | POA: Diagnosis not present

## 2017-04-18 DIAGNOSIS — S2241XD Multiple fractures of ribs, right side, subsequent encounter for fracture with routine healing: Secondary | ICD-10-CM | POA: Diagnosis not present

## 2017-04-18 DIAGNOSIS — S82401D Unspecified fracture of shaft of right fibula, subsequent encounter for closed fracture with routine healing: Secondary | ICD-10-CM | POA: Diagnosis not present

## 2017-04-18 DIAGNOSIS — S82102D Unspecified fracture of upper end of left tibia, subsequent encounter for closed fracture with routine healing: Secondary | ICD-10-CM | POA: Diagnosis not present

## 2017-04-18 DIAGNOSIS — S82101D Unspecified fracture of upper end of right tibia, subsequent encounter for closed fracture with routine healing: Secondary | ICD-10-CM | POA: Diagnosis not present

## 2017-04-18 NOTE — Patient Outreach (Signed)
[facility]) Care Management  04/18/2017  HAILYN ZARR 1946/01/12 [id number]   Transition of care  Week # 1 Referral date: 04/14/17 Referral source: Status post hospital discharge on 04/13/17 from [place] [facility] due to status post ORIF of left tibia/ knee Attempt #3   Third  Telephone call to patient regarding transition of care follow up. Unable to reach patient or leave voice mail message. Message states, the person you are trying to reach does not have a voice mail box set up.  RNCM contacted patients primary MD office. Spoke with Verline Lema who verified patients contact phone number [phone]. RNCM informed Verline Lema that telephone outreach attempts to patient have been unsuccessful and letter will be sent to patient to attempt contact.   Verline Lema reports patient has a post hospital follow up appointment with her primary MD for Thursday, 04/20/17.    PLAN:  RNCM will send patient outreach letter to attempt contact.    Quinn Plowman RN,BSN,CCM [facility] Telephonic  [phone]

## 2017-04-19 DIAGNOSIS — E782 Mixed hyperlipidemia: Secondary | ICD-10-CM | POA: Diagnosis not present

## 2017-04-19 DIAGNOSIS — E663 Overweight: Secondary | ICD-10-CM | POA: Diagnosis not present

## 2017-04-19 DIAGNOSIS — I10 Essential (primary) hypertension: Secondary | ICD-10-CM | POA: Diagnosis not present

## 2017-04-19 DIAGNOSIS — M1991 Primary osteoarthritis, unspecified site: Secondary | ICD-10-CM | POA: Diagnosis not present

## 2017-04-19 DIAGNOSIS — S82102D Unspecified fracture of upper end of left tibia, subsequent encounter for closed fracture with routine healing: Secondary | ICD-10-CM | POA: Diagnosis not present

## 2017-04-19 DIAGNOSIS — J939 Pneumothorax, unspecified: Secondary | ICD-10-CM | POA: Diagnosis not present

## 2017-04-19 DIAGNOSIS — Z681 Body mass index (BMI) 19 or less, adult: Secondary | ICD-10-CM | POA: Diagnosis not present

## 2017-04-21 DIAGNOSIS — S52602D Unspecified fracture of lower end of left ulna, subsequent encounter for closed fracture with routine healing: Secondary | ICD-10-CM | POA: Diagnosis not present

## 2017-04-21 DIAGNOSIS — S82401D Unspecified fracture of shaft of right fibula, subsequent encounter for closed fracture with routine healing: Secondary | ICD-10-CM | POA: Diagnosis not present

## 2017-04-21 DIAGNOSIS — S82102D Unspecified fracture of upper end of left tibia, subsequent encounter for closed fracture with routine healing: Secondary | ICD-10-CM | POA: Diagnosis not present

## 2017-04-21 DIAGNOSIS — S52502D Unspecified fracture of the lower end of left radius, subsequent encounter for closed fracture with routine healing: Secondary | ICD-10-CM | POA: Diagnosis not present

## 2017-04-21 DIAGNOSIS — S82101D Unspecified fracture of upper end of right tibia, subsequent encounter for closed fracture with routine healing: Secondary | ICD-10-CM | POA: Diagnosis not present

## 2017-04-21 DIAGNOSIS — S2241XD Multiple fractures of ribs, right side, subsequent encounter for fracture with routine healing: Secondary | ICD-10-CM | POA: Diagnosis not present

## 2017-04-25 DIAGNOSIS — S82102D Unspecified fracture of upper end of left tibia, subsequent encounter for closed fracture with routine healing: Secondary | ICD-10-CM | POA: Diagnosis not present

## 2017-04-25 DIAGNOSIS — S82101D Unspecified fracture of upper end of right tibia, subsequent encounter for closed fracture with routine healing: Secondary | ICD-10-CM | POA: Diagnosis not present

## 2017-04-25 DIAGNOSIS — S52502D Unspecified fracture of the lower end of left radius, subsequent encounter for closed fracture with routine healing: Secondary | ICD-10-CM | POA: Diagnosis not present

## 2017-04-25 DIAGNOSIS — S52602D Unspecified fracture of lower end of left ulna, subsequent encounter for closed fracture with routine healing: Secondary | ICD-10-CM | POA: Diagnosis not present

## 2017-04-25 DIAGNOSIS — S82401D Unspecified fracture of shaft of right fibula, subsequent encounter for closed fracture with routine healing: Secondary | ICD-10-CM | POA: Diagnosis not present

## 2017-04-25 DIAGNOSIS — S2241XD Multiple fractures of ribs, right side, subsequent encounter for fracture with routine healing: Secondary | ICD-10-CM | POA: Diagnosis not present

## 2017-04-26 DIAGNOSIS — S52602D Unspecified fracture of lower end of left ulna, subsequent encounter for closed fracture with routine healing: Secondary | ICD-10-CM | POA: Diagnosis not present

## 2017-04-26 DIAGNOSIS — S82101D Unspecified fracture of upper end of right tibia, subsequent encounter for closed fracture with routine healing: Secondary | ICD-10-CM | POA: Diagnosis not present

## 2017-04-26 DIAGNOSIS — S2241XD Multiple fractures of ribs, right side, subsequent encounter for fracture with routine healing: Secondary | ICD-10-CM | POA: Diagnosis not present

## 2017-04-26 DIAGNOSIS — S82102D Unspecified fracture of upper end of left tibia, subsequent encounter for closed fracture with routine healing: Secondary | ICD-10-CM | POA: Diagnosis not present

## 2017-04-26 DIAGNOSIS — S82401D Unspecified fracture of shaft of right fibula, subsequent encounter for closed fracture with routine healing: Secondary | ICD-10-CM | POA: Diagnosis not present

## 2017-04-26 DIAGNOSIS — S52502D Unspecified fracture of the lower end of left radius, subsequent encounter for closed fracture with routine healing: Secondary | ICD-10-CM | POA: Diagnosis not present

## 2017-05-03 DIAGNOSIS — S82401D Unspecified fracture of shaft of right fibula, subsequent encounter for closed fracture with routine healing: Secondary | ICD-10-CM | POA: Diagnosis not present

## 2017-05-03 DIAGNOSIS — S82101D Unspecified fracture of upper end of right tibia, subsequent encounter for closed fracture with routine healing: Secondary | ICD-10-CM | POA: Diagnosis not present

## 2017-05-03 DIAGNOSIS — S52602D Unspecified fracture of lower end of left ulna, subsequent encounter for closed fracture with routine healing: Secondary | ICD-10-CM | POA: Diagnosis not present

## 2017-05-03 DIAGNOSIS — S2241XD Multiple fractures of ribs, right side, subsequent encounter for fracture with routine healing: Secondary | ICD-10-CM | POA: Diagnosis not present

## 2017-05-03 DIAGNOSIS — S52502D Unspecified fracture of the lower end of left radius, subsequent encounter for closed fracture with routine healing: Secondary | ICD-10-CM | POA: Diagnosis not present

## 2017-05-03 DIAGNOSIS — S82102D Unspecified fracture of upper end of left tibia, subsequent encounter for closed fracture with routine healing: Secondary | ICD-10-CM | POA: Diagnosis not present

## 2017-05-04 ENCOUNTER — Other Ambulatory Visit: Payer: Self-pay

## 2017-05-04 NOTE — Patient Outreach (Signed)
[facility]) Care Management  05/04/2017  Ann Patel 19-Jan-1946 [id number]  No response from patient after 3 telephone calls and outreach letter attempt.  PLAN; RNCM will refer patient to care management assistant to close due to being unable to contact. RNCM will notify patients primary MD of closure   Quinn Plowman RN,BSN,CCM [facility] Telephonic  [phone]

## 2017-05-04 NOTE — Patient Outreach (Signed)
[facility]) Care Management  05/04/2017  Ann Patel [birth year] [id number]  No response from patient after 3 telephone calls and outreach letter attempt.  PLAN; RNCM will refer patient to care management assistant to close due to being unable to contact. RNCM will notify patient primary MD of closure.   Quinn Plowman RN,BSN,CCM [facility] Telephonic  [phone]

## 2017-05-09 DIAGNOSIS — S52502D Unspecified fracture of the lower end of left radius, subsequent encounter for closed fracture with routine healing: Secondary | ICD-10-CM | POA: Diagnosis not present

## 2017-05-09 DIAGNOSIS — S52602D Unspecified fracture of lower end of left ulna, subsequent encounter for closed fracture with routine healing: Secondary | ICD-10-CM | POA: Diagnosis not present

## 2017-05-09 DIAGNOSIS — S82192D Other fracture of upper end of left tibia, subsequent encounter for closed fracture with routine healing: Secondary | ICD-10-CM | POA: Diagnosis not present

## 2017-05-10 DIAGNOSIS — S82101D Unspecified fracture of upper end of right tibia, subsequent encounter for closed fracture with routine healing: Secondary | ICD-10-CM | POA: Diagnosis not present

## 2017-05-10 DIAGNOSIS — S2241XD Multiple fractures of ribs, right side, subsequent encounter for fracture with routine healing: Secondary | ICD-10-CM | POA: Diagnosis not present

## 2017-05-10 DIAGNOSIS — S52502D Unspecified fracture of the lower end of left radius, subsequent encounter for closed fracture with routine healing: Secondary | ICD-10-CM | POA: Diagnosis not present

## 2017-05-10 DIAGNOSIS — S52602D Unspecified fracture of lower end of left ulna, subsequent encounter for closed fracture with routine healing: Secondary | ICD-10-CM | POA: Diagnosis not present

## 2017-05-10 DIAGNOSIS — S82401D Unspecified fracture of shaft of right fibula, subsequent encounter for closed fracture with routine healing: Secondary | ICD-10-CM | POA: Diagnosis not present

## 2017-05-10 DIAGNOSIS — S82102D Unspecified fracture of upper end of left tibia, subsequent encounter for closed fracture with routine healing: Secondary | ICD-10-CM | POA: Diagnosis not present

## 2017-05-12 DIAGNOSIS — S82102D Unspecified fracture of upper end of left tibia, subsequent encounter for closed fracture with routine healing: Secondary | ICD-10-CM | POA: Diagnosis not present

## 2017-05-12 DIAGNOSIS — S82401D Unspecified fracture of shaft of right fibula, subsequent encounter for closed fracture with routine healing: Secondary | ICD-10-CM | POA: Diagnosis not present

## 2017-05-12 DIAGNOSIS — S2241XD Multiple fractures of ribs, right side, subsequent encounter for fracture with routine healing: Secondary | ICD-10-CM | POA: Diagnosis not present

## 2017-05-12 DIAGNOSIS — S82101D Unspecified fracture of upper end of right tibia, subsequent encounter for closed fracture with routine healing: Secondary | ICD-10-CM | POA: Diagnosis not present

## 2017-05-12 DIAGNOSIS — S52502D Unspecified fracture of the lower end of left radius, subsequent encounter for closed fracture with routine healing: Secondary | ICD-10-CM | POA: Diagnosis not present

## 2017-05-12 DIAGNOSIS — S52602D Unspecified fracture of lower end of left ulna, subsequent encounter for closed fracture with routine healing: Secondary | ICD-10-CM | POA: Diagnosis not present

## 2017-05-16 DIAGNOSIS — S82102D Unspecified fracture of upper end of left tibia, subsequent encounter for closed fracture with routine healing: Secondary | ICD-10-CM | POA: Diagnosis not present

## 2017-05-16 DIAGNOSIS — S82101D Unspecified fracture of upper end of right tibia, subsequent encounter for closed fracture with routine healing: Secondary | ICD-10-CM | POA: Diagnosis not present

## 2017-05-16 DIAGNOSIS — S82401D Unspecified fracture of shaft of right fibula, subsequent encounter for closed fracture with routine healing: Secondary | ICD-10-CM | POA: Diagnosis not present

## 2017-05-16 DIAGNOSIS — S2241XD Multiple fractures of ribs, right side, subsequent encounter for fracture with routine healing: Secondary | ICD-10-CM | POA: Diagnosis not present

## 2017-05-16 DIAGNOSIS — S52502D Unspecified fracture of the lower end of left radius, subsequent encounter for closed fracture with routine healing: Secondary | ICD-10-CM | POA: Diagnosis not present

## 2017-05-16 DIAGNOSIS — S52602D Unspecified fracture of lower end of left ulna, subsequent encounter for closed fracture with routine healing: Secondary | ICD-10-CM | POA: Diagnosis not present

## 2017-05-17 DIAGNOSIS — S82101D Unspecified fracture of upper end of right tibia, subsequent encounter for closed fracture with routine healing: Secondary | ICD-10-CM | POA: Diagnosis not present

## 2017-05-17 DIAGNOSIS — S82102D Unspecified fracture of upper end of left tibia, subsequent encounter for closed fracture with routine healing: Secondary | ICD-10-CM | POA: Diagnosis not present

## 2017-05-17 DIAGNOSIS — S52602D Unspecified fracture of lower end of left ulna, subsequent encounter for closed fracture with routine healing: Secondary | ICD-10-CM | POA: Diagnosis not present

## 2017-05-17 DIAGNOSIS — S52502D Unspecified fracture of the lower end of left radius, subsequent encounter for closed fracture with routine healing: Secondary | ICD-10-CM | POA: Diagnosis not present

## 2017-05-17 DIAGNOSIS — S2241XD Multiple fractures of ribs, right side, subsequent encounter for fracture with routine healing: Secondary | ICD-10-CM | POA: Diagnosis not present

## 2017-05-17 DIAGNOSIS — S82401D Unspecified fracture of shaft of right fibula, subsequent encounter for closed fracture with routine healing: Secondary | ICD-10-CM | POA: Diagnosis not present

## 2017-05-19 DIAGNOSIS — S82401D Unspecified fracture of shaft of right fibula, subsequent encounter for closed fracture with routine healing: Secondary | ICD-10-CM | POA: Diagnosis not present

## 2017-05-19 DIAGNOSIS — S2241XD Multiple fractures of ribs, right side, subsequent encounter for fracture with routine healing: Secondary | ICD-10-CM | POA: Diagnosis not present

## 2017-05-19 DIAGNOSIS — S52502D Unspecified fracture of the lower end of left radius, subsequent encounter for closed fracture with routine healing: Secondary | ICD-10-CM | POA: Diagnosis not present

## 2017-05-19 DIAGNOSIS — S52602D Unspecified fracture of lower end of left ulna, subsequent encounter for closed fracture with routine healing: Secondary | ICD-10-CM | POA: Diagnosis not present

## 2017-05-19 DIAGNOSIS — S82102D Unspecified fracture of upper end of left tibia, subsequent encounter for closed fracture with routine healing: Secondary | ICD-10-CM | POA: Diagnosis not present

## 2017-05-19 DIAGNOSIS — S82101D Unspecified fracture of upper end of right tibia, subsequent encounter for closed fracture with routine healing: Secondary | ICD-10-CM | POA: Diagnosis not present

## 2017-05-22 ENCOUNTER — Other Ambulatory Visit: Payer: Self-pay | Admitting: Cardiovascular Disease

## 2017-05-23 DIAGNOSIS — S82101D Unspecified fracture of upper end of right tibia, subsequent encounter for closed fracture with routine healing: Secondary | ICD-10-CM | POA: Diagnosis not present

## 2017-05-23 DIAGNOSIS — S82401D Unspecified fracture of shaft of right fibula, subsequent encounter for closed fracture with routine healing: Secondary | ICD-10-CM | POA: Diagnosis not present

## 2017-05-23 DIAGNOSIS — S82102D Unspecified fracture of upper end of left tibia, subsequent encounter for closed fracture with routine healing: Secondary | ICD-10-CM | POA: Diagnosis not present

## 2017-05-23 DIAGNOSIS — S52602D Unspecified fracture of lower end of left ulna, subsequent encounter for closed fracture with routine healing: Secondary | ICD-10-CM | POA: Diagnosis not present

## 2017-05-23 DIAGNOSIS — S2241XD Multiple fractures of ribs, right side, subsequent encounter for fracture with routine healing: Secondary | ICD-10-CM | POA: Diagnosis not present

## 2017-05-23 DIAGNOSIS — S52502D Unspecified fracture of the lower end of left radius, subsequent encounter for closed fracture with routine healing: Secondary | ICD-10-CM | POA: Diagnosis not present

## 2017-05-24 DIAGNOSIS — S2241XD Multiple fractures of ribs, right side, subsequent encounter for fracture with routine healing: Secondary | ICD-10-CM | POA: Diagnosis not present

## 2017-05-24 DIAGNOSIS — S82102D Unspecified fracture of upper end of left tibia, subsequent encounter for closed fracture with routine healing: Secondary | ICD-10-CM | POA: Diagnosis not present

## 2017-05-24 DIAGNOSIS — S82401D Unspecified fracture of shaft of right fibula, subsequent encounter for closed fracture with routine healing: Secondary | ICD-10-CM | POA: Diagnosis not present

## 2017-05-24 DIAGNOSIS — S82101D Unspecified fracture of upper end of right tibia, subsequent encounter for closed fracture with routine healing: Secondary | ICD-10-CM | POA: Diagnosis not present

## 2017-05-24 DIAGNOSIS — S52602D Unspecified fracture of lower end of left ulna, subsequent encounter for closed fracture with routine healing: Secondary | ICD-10-CM | POA: Diagnosis not present

## 2017-05-24 DIAGNOSIS — S52502D Unspecified fracture of the lower end of left radius, subsequent encounter for closed fracture with routine healing: Secondary | ICD-10-CM | POA: Diagnosis not present

## 2017-05-26 DIAGNOSIS — S52602D Unspecified fracture of lower end of left ulna, subsequent encounter for closed fracture with routine healing: Secondary | ICD-10-CM | POA: Diagnosis not present

## 2017-05-26 DIAGNOSIS — S2241XD Multiple fractures of ribs, right side, subsequent encounter for fracture with routine healing: Secondary | ICD-10-CM | POA: Diagnosis not present

## 2017-05-26 DIAGNOSIS — S82401D Unspecified fracture of shaft of right fibula, subsequent encounter for closed fracture with routine healing: Secondary | ICD-10-CM | POA: Diagnosis not present

## 2017-05-26 DIAGNOSIS — S52502D Unspecified fracture of the lower end of left radius, subsequent encounter for closed fracture with routine healing: Secondary | ICD-10-CM | POA: Diagnosis not present

## 2017-05-26 DIAGNOSIS — S82101D Unspecified fracture of upper end of right tibia, subsequent encounter for closed fracture with routine healing: Secondary | ICD-10-CM | POA: Diagnosis not present

## 2017-05-26 DIAGNOSIS — S82102D Unspecified fracture of upper end of left tibia, subsequent encounter for closed fracture with routine healing: Secondary | ICD-10-CM | POA: Diagnosis not present

## 2017-05-30 DIAGNOSIS — S82102D Unspecified fracture of upper end of left tibia, subsequent encounter for closed fracture with routine healing: Secondary | ICD-10-CM | POA: Diagnosis not present

## 2017-05-30 DIAGNOSIS — S82101D Unspecified fracture of upper end of right tibia, subsequent encounter for closed fracture with routine healing: Secondary | ICD-10-CM | POA: Diagnosis not present

## 2017-05-30 DIAGNOSIS — S82401D Unspecified fracture of shaft of right fibula, subsequent encounter for closed fracture with routine healing: Secondary | ICD-10-CM | POA: Diagnosis not present

## 2017-05-30 DIAGNOSIS — S52502D Unspecified fracture of the lower end of left radius, subsequent encounter for closed fracture with routine healing: Secondary | ICD-10-CM | POA: Diagnosis not present

## 2017-05-30 DIAGNOSIS — S2241XD Multiple fractures of ribs, right side, subsequent encounter for fracture with routine healing: Secondary | ICD-10-CM | POA: Diagnosis not present

## 2017-05-30 DIAGNOSIS — S52602D Unspecified fracture of lower end of left ulna, subsequent encounter for closed fracture with routine healing: Secondary | ICD-10-CM | POA: Diagnosis not present

## 2017-05-31 DIAGNOSIS — S82102D Unspecified fracture of upper end of left tibia, subsequent encounter for closed fracture with routine healing: Secondary | ICD-10-CM | POA: Diagnosis not present

## 2017-05-31 DIAGNOSIS — S52502D Unspecified fracture of the lower end of left radius, subsequent encounter for closed fracture with routine healing: Secondary | ICD-10-CM | POA: Diagnosis not present

## 2017-05-31 DIAGNOSIS — S2241XD Multiple fractures of ribs, right side, subsequent encounter for fracture with routine healing: Secondary | ICD-10-CM | POA: Diagnosis not present

## 2017-05-31 DIAGNOSIS — S52602D Unspecified fracture of lower end of left ulna, subsequent encounter for closed fracture with routine healing: Secondary | ICD-10-CM | POA: Diagnosis not present

## 2017-05-31 DIAGNOSIS — S82101D Unspecified fracture of upper end of right tibia, subsequent encounter for closed fracture with routine healing: Secondary | ICD-10-CM | POA: Diagnosis not present

## 2017-05-31 DIAGNOSIS — S82401D Unspecified fracture of shaft of right fibula, subsequent encounter for closed fracture with routine healing: Secondary | ICD-10-CM | POA: Diagnosis not present

## 2017-06-02 DIAGNOSIS — S82102D Unspecified fracture of upper end of left tibia, subsequent encounter for closed fracture with routine healing: Secondary | ICD-10-CM | POA: Diagnosis not present

## 2017-06-02 DIAGNOSIS — S52602D Unspecified fracture of lower end of left ulna, subsequent encounter for closed fracture with routine healing: Secondary | ICD-10-CM | POA: Diagnosis not present

## 2017-06-02 DIAGNOSIS — S2241XD Multiple fractures of ribs, right side, subsequent encounter for fracture with routine healing: Secondary | ICD-10-CM | POA: Diagnosis not present

## 2017-06-02 DIAGNOSIS — S82401D Unspecified fracture of shaft of right fibula, subsequent encounter for closed fracture with routine healing: Secondary | ICD-10-CM | POA: Diagnosis not present

## 2017-06-02 DIAGNOSIS — S82101D Unspecified fracture of upper end of right tibia, subsequent encounter for closed fracture with routine healing: Secondary | ICD-10-CM | POA: Diagnosis not present

## 2017-06-02 DIAGNOSIS — S52502D Unspecified fracture of the lower end of left radius, subsequent encounter for closed fracture with routine healing: Secondary | ICD-10-CM | POA: Diagnosis not present

## 2017-06-06 DIAGNOSIS — S82101D Unspecified fracture of upper end of right tibia, subsequent encounter for closed fracture with routine healing: Secondary | ICD-10-CM | POA: Diagnosis not present

## 2017-06-06 DIAGNOSIS — S82401D Unspecified fracture of shaft of right fibula, subsequent encounter for closed fracture with routine healing: Secondary | ICD-10-CM | POA: Diagnosis not present

## 2017-06-06 DIAGNOSIS — S52602D Unspecified fracture of lower end of left ulna, subsequent encounter for closed fracture with routine healing: Secondary | ICD-10-CM | POA: Diagnosis not present

## 2017-06-06 DIAGNOSIS — S82102D Unspecified fracture of upper end of left tibia, subsequent encounter for closed fracture with routine healing: Secondary | ICD-10-CM | POA: Diagnosis not present

## 2017-06-06 DIAGNOSIS — S52502D Unspecified fracture of the lower end of left radius, subsequent encounter for closed fracture with routine healing: Secondary | ICD-10-CM | POA: Diagnosis not present

## 2017-06-06 DIAGNOSIS — S2241XD Multiple fractures of ribs, right side, subsequent encounter for fracture with routine healing: Secondary | ICD-10-CM | POA: Diagnosis not present

## 2017-06-08 ENCOUNTER — Telehealth (HOSPITAL_COMMUNITY): Payer: Self-pay

## 2017-06-08 DIAGNOSIS — S82401D Unspecified fracture of shaft of right fibula, subsequent encounter for closed fracture with routine healing: Secondary | ICD-10-CM | POA: Diagnosis not present

## 2017-06-08 DIAGNOSIS — S82102D Unspecified fracture of upper end of left tibia, subsequent encounter for closed fracture with routine healing: Secondary | ICD-10-CM | POA: Diagnosis not present

## 2017-06-08 DIAGNOSIS — S2241XD Multiple fractures of ribs, right side, subsequent encounter for fracture with routine healing: Secondary | ICD-10-CM | POA: Diagnosis not present

## 2017-06-08 DIAGNOSIS — S52602D Unspecified fracture of lower end of left ulna, subsequent encounter for closed fracture with routine healing: Secondary | ICD-10-CM | POA: Diagnosis not present

## 2017-06-08 DIAGNOSIS — S52502D Unspecified fracture of the lower end of left radius, subsequent encounter for closed fracture with routine healing: Secondary | ICD-10-CM | POA: Diagnosis not present

## 2017-06-08 DIAGNOSIS — S82101D Unspecified fracture of upper end of right tibia, subsequent encounter for closed fracture with routine healing: Secondary | ICD-10-CM | POA: Diagnosis not present

## 2017-06-08 NOTE — Telephone Encounter (Signed)
Pt will schedule PT and [place]  With [facility] will call MD to request OT order to be sent as soon as possible. Pt will be D/c from home care on 06/09/17.NF

## 2017-06-09 DIAGNOSIS — S2241XD Multiple fractures of ribs, right side, subsequent encounter for fracture with routine healing: Secondary | ICD-10-CM | POA: Diagnosis not present

## 2017-06-09 DIAGNOSIS — S82102D Unspecified fracture of upper end of left tibia, subsequent encounter for closed fracture with routine healing: Secondary | ICD-10-CM | POA: Diagnosis not present

## 2017-06-09 DIAGNOSIS — S82101D Unspecified fracture of upper end of right tibia, subsequent encounter for closed fracture with routine healing: Secondary | ICD-10-CM | POA: Diagnosis not present

## 2017-06-09 DIAGNOSIS — S52502D Unspecified fracture of the lower end of left radius, subsequent encounter for closed fracture with routine healing: Secondary | ICD-10-CM | POA: Diagnosis not present

## 2017-06-09 DIAGNOSIS — S52602D Unspecified fracture of lower end of left ulna, subsequent encounter for closed fracture with routine healing: Secondary | ICD-10-CM | POA: Diagnosis not present

## 2017-06-09 DIAGNOSIS — S82401D Unspecified fracture of shaft of right fibula, subsequent encounter for closed fracture with routine healing: Secondary | ICD-10-CM | POA: Diagnosis not present

## 2017-06-14 ENCOUNTER — Other Ambulatory Visit: Payer: Self-pay | Admitting: Cardiovascular Disease

## 2017-06-20 DIAGNOSIS — M24111 Other articular cartilage disorders, right shoulder: Secondary | ICD-10-CM | POA: Diagnosis not present

## 2017-06-20 DIAGNOSIS — S52602D Unspecified fracture of lower end of left ulna, subsequent encounter for closed fracture with routine healing: Secondary | ICD-10-CM | POA: Diagnosis not present

## 2017-06-20 DIAGNOSIS — M25512 Pain in left shoulder: Secondary | ICD-10-CM | POA: Diagnosis not present

## 2017-06-20 DIAGNOSIS — M25511 Pain in right shoulder: Secondary | ICD-10-CM | POA: Diagnosis not present

## 2017-06-20 DIAGNOSIS — S52502D Unspecified fracture of the lower end of left radius, subsequent encounter for closed fracture with routine healing: Secondary | ICD-10-CM | POA: Diagnosis not present

## 2017-06-20 DIAGNOSIS — S82192D Other fracture of upper end of left tibia, subsequent encounter for closed fracture with routine healing: Secondary | ICD-10-CM | POA: Diagnosis not present

## 2017-06-20 DIAGNOSIS — I2510 Atherosclerotic heart disease of native coronary artery without angina pectoris: Secondary | ICD-10-CM | POA: Diagnosis not present

## 2017-06-20 DIAGNOSIS — I10 Essential (primary) hypertension: Secondary | ICD-10-CM | POA: Diagnosis not present

## 2017-06-20 DIAGNOSIS — M8588 Other specified disorders of bone density and structure, other site: Secondary | ICD-10-CM | POA: Diagnosis not present

## 2017-06-20 DIAGNOSIS — S82102D Unspecified fracture of upper end of left tibia, subsequent encounter for closed fracture with routine healing: Secondary | ICD-10-CM | POA: Diagnosis not present

## 2017-06-20 DIAGNOSIS — M19012 Primary osteoarthritis, left shoulder: Secondary | ICD-10-CM | POA: Diagnosis not present

## 2017-06-20 DIAGNOSIS — M25711 Osteophyte, right shoulder: Secondary | ICD-10-CM | POA: Diagnosis not present

## 2017-06-20 DIAGNOSIS — M85862 Other specified disorders of bone density and structure, left lower leg: Secondary | ICD-10-CM | POA: Diagnosis not present

## 2017-06-20 DIAGNOSIS — M19011 Primary osteoarthritis, right shoulder: Secondary | ICD-10-CM | POA: Diagnosis not present

## 2017-06-21 ENCOUNTER — Encounter (HOSPITAL_COMMUNITY): Payer: Self-pay | Admitting: Occupational Therapy

## 2017-06-21 ENCOUNTER — Ambulatory Visit (HOSPITAL_COMMUNITY): Payer: Commercial Managed Care - HMO | Admitting: Occupational Therapy

## 2017-06-21 ENCOUNTER — Ambulatory Visit (HOSPITAL_COMMUNITY): Payer: Commercial Managed Care - HMO | Attending: Physician Assistant

## 2017-06-21 ENCOUNTER — Encounter (INDEPENDENT_AMBULATORY_CARE_PROVIDER_SITE_OTHER): Payer: Self-pay

## 2017-06-21 ENCOUNTER — Encounter (HOSPITAL_COMMUNITY): Payer: Self-pay

## 2017-06-21 DIAGNOSIS — M6281 Muscle weakness (generalized): Secondary | ICD-10-CM | POA: Diagnosis not present

## 2017-06-21 DIAGNOSIS — R262 Difficulty in walking, not elsewhere classified: Secondary | ICD-10-CM | POA: Insufficient documentation

## 2017-06-21 DIAGNOSIS — R29898 Other symptoms and signs involving the musculoskeletal system: Secondary | ICD-10-CM | POA: Insufficient documentation

## 2017-06-21 DIAGNOSIS — M25661 Stiffness of right knee, not elsewhere classified: Secondary | ICD-10-CM | POA: Diagnosis not present

## 2017-06-21 DIAGNOSIS — M25632 Stiffness of left wrist, not elsewhere classified: Secondary | ICD-10-CM | POA: Diagnosis not present

## 2017-06-21 DIAGNOSIS — M25642 Stiffness of left hand, not elsewhere classified: Secondary | ICD-10-CM | POA: Diagnosis not present
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
## 2017-06-21 NOTE — Patient Instructions (Signed)
  Quad sets with pillow under heel  Place a pillow or towel roll under your heel with your leg out straight.  Contract your quad muscle and push the knee down toward the bed/table that you are on, then relax.  Perform 1-2x/day, 2-3 sets of 10 holding for 5 seconds   HEEL SLIDES - LONG SIT WITH TOWEL AND BELT  While in a sitting position, place a small hand towel under your heel. Next, loop a belt, towel or bed sheet around your foot and pull your knee into a bend position as your foot slides towards your buttock. Hold a gentle stretch and then return back to original position.  Perform 1-2x/day, 2-3 sets of 10 holding for 5 seconds

## 2017-06-27 ENCOUNTER — Telehealth (HOSPITAL_COMMUNITY): Payer: Self-pay | Admitting: Physical Therapy

## 2017-06-27 ENCOUNTER — Ambulatory Visit (HOSPITAL_COMMUNITY): Payer: Commercial Managed Care - HMO | Admitting: Physical Therapy

## 2017-06-27 DIAGNOSIS — M25632 Stiffness of left wrist, not elsewhere classified: Secondary | ICD-10-CM

## 2017-06-27 DIAGNOSIS — M25661 Stiffness of right knee, not elsewhere classified: Secondary | ICD-10-CM

## 2017-06-27 DIAGNOSIS — R262 Difficulty in walking, not elsewhere classified: Secondary | ICD-10-CM | POA: Diagnosis not present

## 2017-06-27 DIAGNOSIS — M25642 Stiffness of left hand, not elsewhere classified: Secondary | ICD-10-CM | POA: Diagnosis not present

## 2017-06-27 DIAGNOSIS — M6281 Muscle weakness (generalized): Secondary | ICD-10-CM | POA: Diagnosis not present

## 2017-06-27 DIAGNOSIS — R29898 Other symptoms and signs involving the musculoskeletal system: Secondary | ICD-10-CM | POA: Diagnosis not present
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
## 2017-06-27 NOTE — Therapy (Signed)
[place], [place], [address] Phone: [phone]   Fax:  [phone]  Physical Therapy Treatment  Patient Details  Name: Ann Patel MRN: [id number] Date of Birth: [birth year] Referring Provider: Corene Cornea Heggerick, PA-C  Encounter Date: 06/27/2017      PT End of Session - 06/27/17 1544    Visit Number 2   Number of Visits 13   Date for PT Re-Evaluation 07/12/17   Authorization Type [employer]   Authorization Time Period 06/21/17 to 08/02/17   Authorization - Visit Number 2   Authorization - Number of Visits 10   PT Start Time 5643   PT Stop Time 1430   PT Time Calculation (min) 42 min   Activity Tolerance Patient tolerated treatment well;No increased pain   Behavior During Therapy WFL for tasks assessed/performed      Past Medical History:  Diagnosis Date  . Coronary artery disease 09/2009   stress test EF 67%  . Hypercholesteremia   . Hypertension   . Myocardial infarction [facility])     Past Surgical History:  Procedure Laterality Date  . BREAST CYST EXCISION     fibro cyst removal  . CORONARY ANGIOPLASTY WITH STENT PLACEMENT Right 07/2005   receiving two consecutive drug eluting 2.5x27mm Taxus stents in the proximal to mid right coronary artery  . TONSILLECTOMY      There were no vitals filed for this visit.      Subjective Assessment - 06/27/17 1347    Subjective Pt states she is doing well currently and without pain . Comes using a quad cane this session and with noted antalgia.   Currently in Pain? No/denies                         [facility] Adult PT Treatment/Exercise - 06/27/17 0001      Knee/Hip Exercises: Stretches   Active Hamstring Stretch Left;3 reps;20 seconds   Active Hamstring Stretch Limitations 12" step to increase extension   Knee: Self-Stretch to increase Flexion Left;10 seconds   Knee: Self-Stretch Limitations 10 reps on 12" step     Knee/Hip  Exercises: Standing   Heel Raises 10 reps   Heel Raises Limitations toerasies 10 reps   Hip Flexion 10 reps   Hip Flexion Limitations alternating march with 3" holds 1 UE assist   Hip Abduction Both;10 reps   Hip Extension Both;10 reps   SLS max of 3 Rt: 30" Lt: 5"   SLS with Vectors lt only 5X3" holds with 1 HHA                  PT Short Term Goals - 06/21/17 1452      PT SHORT TERM GOAL #1   Title Pt will be independent with HEP and perform consistently to maximize return to PLOF.   Time 3   Period Weeks   Status New   Target Date 07/12/17     PT SHORT TERM GOAL #2   Title Pt will have improved L knee AROM at least 5-110 to maximize gait.    Time 3   Period Weeks   Status New     PT SHORT TERM GOAL #3   Title Pt will have improved 5xSTS to <10 sec with proper mechanics and weight shift over LLE to demo improved functional strength.   Time 3   Period Weeks   Status New  PT Long Term Goals - 06/21/17 1455      PT LONG TERM GOAL #1   Title Pt will have improved L knee AROM to 0-120 to maximize gait and return to PLOF.   Time 6   Period Weeks   Status New   Target Date 08/02/17     PT LONG TERM GOAL #2   Title Pt will have improved TUG to 10 sec or < with no AD to demo improved gait and community participation.   Time 6   Period Weeks   Status New     PT LONG TERM GOAL #3   Title Pt will have improved SLS on L to at least 15 sec with min to no unsteadiness to demo improved balance and maximize gait on uneven ground   Time 6   Period Weeks   Status New     PT LONG TERM GOAL #4   Title Pt will have improved 3MWT to 610ft or > with LRAD and with proper gait mechanics to demo improved overall function.    Time 6   Period Weeks   Status New     PT LONG TERM GOAL #5   Title Pt will report being able to return to her regular walking program at least 3x/week to demo improved tolerance to activity, gait, balance, and strength.    Time 6    Period Weeks   Status New               Plan - 06/27/17 1545    Clinical Impression Statement Reveiwed HEP, however forgot to issue and review copy of evaluation.  Progressed standing functional strength activities and began static balance task.  pt able to maintain single leg balance on Rt LE for 30", however extreme difficulty maintaining on Lt greater than 5 seconds.  Added vector stance to help achieve this and improve LE and glute stability.  Manual completed to Lt LE with noted induration and scar tissue.  Able to loosen tissue and lessen induration following.  Pt with increased tension in medial quad and adductor musculature when completing AA and PROM to increase extension.  Pt with difficulty relaxing this.  Attmepted prone to work on posterior LE and complete further stretch, however patient refused transitioning to prone stating she could not do this due to her Lt UE.  Spoke to Network engineer Ann Patel) to get OT appt set up for pateint.    Rehab Potential Good   PT Frequency 2x / week   PT Duration 6 weeks   PT Treatment/Interventions ADLs/Self Care Home Management;Cryotherapy;Electrical Stimulation;Moist Heat;Ultrasound;DME Instruction;Gait training;Stair training;Functional mobility training;Therapeutic activities;Therapeutic exercise;Balance training;Neuromuscular re-education;Patient/family education;Manual techniques;Scar mobilization;Passive range of motion;Dry needling;Energy conservation;Taping   PT Next Visit Plan review goals and issue written evaluation. Continue to improve knee AROM, functional strengthening, balance training, manual techniques for edema and joint mobilizations   PT Home Exercise Plan eval: quad sets, heel slides   Consulted and Agree with Plan of Care Patient      Patient will benefit from skilled therapeutic intervention in order to improve the following deficits and impairments:  Abnormal gait, Decreased activity tolerance, Decreased balance, Decreased  endurance, Decreased mobility, Decreased range of motion, Decreased strength, Difficulty walking, Hypomobility, Increased fascial restricitons, Increased muscle spasms, Impaired flexibility, Impaired UE functional use, Improper body mechanics  Visit Diagnosis: Stiffness of left wrist, not elsewhere classified  Stiffness of left hand, not elsewhere classified  Other symptoms and signs involving the musculoskeletal system  Stiffness of right  knee, not elsewhere classified     Problem List Patient Active Problem List   Diagnosis Date Noted  . Left carotid bruit 03/15/2013  . Aortic valve stenosis, mild 03/15/2013  . Essential hypertension   . Hypercholesteremia   . Coronary artery disease 09/02/2009   Teena Irani, PTA/CLT [phone]  Teena Irani 06/27/2017, 3:55 PM  [place] [address] [place], [place], [address] Phone: [phone]   Fax:  [phone]  Name: Ann Patel MRN: [id number] Date of Birth: 10-Aug-1946

## 2017-06-28 ENCOUNTER — Ambulatory Visit (HOSPITAL_COMMUNITY): Payer: Commercial Managed Care - HMO

## 2017-06-28 ENCOUNTER — Encounter (HOSPITAL_COMMUNITY): Payer: Self-pay

## 2017-06-28 DIAGNOSIS — M6281 Muscle weakness (generalized): Secondary | ICD-10-CM

## 2017-06-28 DIAGNOSIS — M25661 Stiffness of right knee, not elsewhere classified: Secondary | ICD-10-CM | POA: Diagnosis not present

## 2017-06-28 DIAGNOSIS — R29898 Other symptoms and signs involving the musculoskeletal system: Secondary | ICD-10-CM | POA: Diagnosis not present

## 2017-06-28 DIAGNOSIS — M25632 Stiffness of left wrist, not elsewhere classified: Secondary | ICD-10-CM | POA: Diagnosis not present

## 2017-06-28 DIAGNOSIS — R262 Difficulty in walking, not elsewhere classified: Secondary | ICD-10-CM | POA: Diagnosis not present

## 2017-06-28 DIAGNOSIS — M25642 Stiffness of left hand, not elsewhere classified: Secondary | ICD-10-CM | POA: Diagnosis not present

## 2017-06-28 NOTE — Therapy (Signed)
[place], [place], [address] Phone: [phone]   Fax:  [phone]  Physical Therapy Treatment  Patient Details  Name: Ann Patel MRN: [id number] Date of Birth: 11-16-1945 Referring Provider: Corene Cornea Heggerick, PA-C  Encounter Date: 06/28/2017      PT End of Session - 06/28/17 1429    Visit Number 3   Number of Visits 13   Date for PT Re-Evaluation 07/12/17   Authorization Type [employer]   Authorization Time Period 06/21/17 to 08/02/17   Authorization - Visit Number 3   Authorization - Number of Visits 10   PT Start Time 1340   PT Stop Time 1425   PT Time Calculation (min) 45 min   Equipment Utilized During Treatment Gait belt   Activity Tolerance Patient tolerated treatment well;No increased pain   Behavior During Therapy WFL for tasks assessed/performed      Past Medical History:  Diagnosis Date  . Coronary artery disease 09/2009   stress test EF 67%  . Hypercholesteremia   . Hypertension   . Myocardial infarction [facility])     Past Surgical History:  Procedure Laterality Date  . BREAST CYST EXCISION     fibro cyst removal  . CORONARY ANGIOPLASTY WITH STENT PLACEMENT Right 07/2005   receiving two consecutive drug eluting 2.5x33mm Taxus stents in the proximal to mid right coronary artery  . TONSILLECTOMY      There were no vitals filed for this visit.      Subjective Assessment - 06/28/17 1332    Subjective Pt states that she is dong well today but has some stiffness. Continues using quad cane.    Currently in Pain? No/denies            [facility] PT Assessment - 06/28/17 0001      Precautions   Precautions None     Restrictions   Weight Bearing Restrictions No     Prior Function   Level of Independence Independent   Vocation Retired                     [employer] [facility] Adult PT Treatment/Exercise - 06/28/17 0001      Ambulation/Gait   Ambulation Distance (Feet)  744 Feet  3MWT feet    Assistive device Small based quad cane   Gait Pattern Step-through pattern;Decreased hip/knee flexion - left;Left circumduction;Left hip hike     Knee/Hip Exercises: Standing   Heel Raises 15 reps   Heel Raises Limitations toerasies 10 reps   Hip Flexion 10 reps   Hip Flexion Limitations 4" step alternate march   Hip Abduction Both;10 reps   Hip Extension Both;10 reps   Forward Step Up 10 reps   Forward Step Up Limitations 4" step   SLS 3 reps Rt. and Lt. 10s; max Lt 20 sec   Gait Training Hurdles 4" forward and lateral x 3 laps   Other Standing Knee Exercises increased difficulty Lt. knee flexion; cues to avoid circumduction      Knee/Hip Exercises: Seated   Other Seated Knee/Hip Exercises Knee extension 2# x 10 reps     Knee/Hip Exercises: Supine   Quad Sets 10 reps   Quad Sets Limitations minimal activation that increased with reps   Heel Slides Left;10 reps   Heel Slides Limitations 10sec hold                   PT Short Term Goals - 06/21/17  [place] #1   Title Pt will be independent with HEP and perform consistently to maximize return to PLOF.   Time 3   Period Weeks   Status New   Target Date 07/12/17     PT SHORT TERM GOAL #2   Title Pt will have improved L knee AROM at least 5-110 to maximize gait.    Time 3   Period Weeks   Status New     PT SHORT TERM GOAL #3   Title Pt will have improved 5xSTS to <10 sec with proper mechanics and weight shift over LLE to demo improved functional strength.   Time 3   Period Weeks   Status New           PT Long Term Goals - 06/21/17 1455      PT LONG TERM GOAL #1   Title Pt will have improved L knee AROM to 0-120 to maximize gait and return to PLOF.   Time 6   Period Weeks   Status New   Target Date 08/02/17     PT LONG TERM GOAL #2   Title Pt will have improved TUG to 10 sec or < with no AD to demo improved gait and community participation.   Time 6    Period Weeks   Status New     PT LONG TERM GOAL #3   Title Pt will have improved SLS on L to at least 15 sec with min to no unsteadiness to demo improved balance and maximize gait on uneven ground   Time 6   Period Weeks   Status New     PT LONG TERM GOAL #4   Title Pt will have improved 3MWT to 633ft or > with LRAD and with proper gait mechanics to demo improved overall function.    Time 6   Period Weeks   Status New     PT LONG TERM GOAL #5   Title Pt will report being able to return to her regular walking program at least 3x/week to demo improved tolerance to activity, gait, balance, and strength.    Time 6   Period Weeks   Status New               Plan - 06/28/17 1429    Clinical Impression Statement Pt tolerated session well today. She had minimal quad contraction with quad set in supine, however, duration and consistent activation improved with repetition. She was able to tolerate increased distane in ambulation today and complete dynamic functional balance well. She requires cueing to limit Lt. hip abduction compensation secondary to hip weakness and knee ROM limitations. Sister notes she is worried about how much weight she has lost and request weight be taken today - 122.2 lbs.    PT Next Visit Plan Continue to improve functional strengthening, knee AROM, balance traning and manual techniques for edema and joint mobilization    Consulted and Agree with Plan of Care Patient      Patient will benefit from skilled therapeutic intervention in order to improve the following deficits and impairments:  Abnormal gait, Decreased activity tolerance, Decreased balance, Decreased endurance, Decreased mobility, Decreased range of motion, Decreased strength, Difficulty walking, Hypomobility, Increased fascial restricitons, Increased muscle spasms, Impaired flexibility, Impaired UE functional use, Improper body mechanics  Visit Diagnosis: Stiffness of right knee, not elsewhere  classified  Muscle weakness (generalized)  Difficulty in walking, not elsewhere classified  Problem List Patient Active Problem List   Diagnosis Date Noted  . Left carotid bruit 03/15/2013  . Aortic valve stenosis, mild 03/15/2013  . Essential hypertension   . Hypercholesteremia   . Coronary artery disease 09/02/2009   Starr Lake PT, DPT 2:33 PM, 06/28/17 [phone]  Starr Lake 06/28/2017, 2:33 PM  [place] [address] [place], [place], [address] Phone: [phone]   Fax:  [phone]  Name: Ann Patel MRN: [id number] Date of Birth: 08/23/1946

## 2017-06-29 ENCOUNTER — Encounter (HOSPITAL_COMMUNITY): Payer: Commercial Managed Care - HMO

## 2017-07-04 ENCOUNTER — Ambulatory Visit (HOSPITAL_COMMUNITY): Payer: Commercial Managed Care - HMO | Admitting: Occupational Therapy

## 2017-07-04 ENCOUNTER — Encounter (HOSPITAL_COMMUNITY): Payer: Self-pay | Admitting: Occupational Therapy

## 2017-07-04 ENCOUNTER — Ambulatory Visit (HOSPITAL_COMMUNITY): Payer: Commercial Managed Care - HMO | Attending: Physician Assistant

## 2017-07-04 DIAGNOSIS — M25632 Stiffness of left wrist, not elsewhere classified: Secondary | ICD-10-CM | POA: Insufficient documentation

## 2017-07-04 DIAGNOSIS — R29898 Other symptoms and signs involving the musculoskeletal system: Secondary | ICD-10-CM

## 2017-07-04 DIAGNOSIS — M25642 Stiffness of left hand, not elsewhere classified: Secondary | ICD-10-CM

## 2017-07-04 DIAGNOSIS — M6281 Muscle weakness (generalized): Secondary | ICD-10-CM | POA: Insufficient documentation

## 2017-07-04 DIAGNOSIS — R262 Difficulty in walking, not elsewhere classified: Secondary | ICD-10-CM | POA: Diagnosis not present

## 2017-07-04 DIAGNOSIS — M25661 Stiffness of right knee, not elsewhere classified: Secondary | ICD-10-CM | POA: Diagnosis not present

## 2017-07-04 NOTE — Therapy (Signed)
[place], [place], [address] Phone: [phone]   Fax:  [phone]  Occupational Therapy Treatment  Patient Details  Name: NANSI BIRMINGHAM MRN: [id number] Date of Birth: 1946/04/13 Referring Provider: Corene Cornea Heggerick, Pa-C  Encounter Date: 07/04/2017      OT End of Session - 07/04/17 1359    Visit Number 2   Number of Visits 18   Date for OT Re-Evaluation 08/04/17   Authorization Type [employer] Insurance being billed; pt is bringing information   OT Start Time 1301   OT Stop Time 1345   OT Time Calculation (min) 44 min   Activity Tolerance Patient tolerated treatment well   Behavior During Therapy [facility] for tasks assessed/performed      Past Medical History:  Diagnosis Date  . Coronary artery disease 09/2009   stress test EF 67%  . Hypercholesteremia   . Hypertension   . Myocardial infarction [facility])     Past Surgical History:  Procedure Laterality Date  . BREAST CYST EXCISION     fibro cyst removal  . CORONARY ANGIOPLASTY WITH STENT PLACEMENT Right 07/2005   receiving two consecutive drug eluting 2.5x69mm Taxus stents in the proximal to mid right coronary artery  . TONSILLECTOMY      There were no vitals filed for this visit.      Subjective Assessment - 07/04/17 1301    Subjective  S: I run hot water over it sometimes.    Currently in Pain? No/denies            [facility] OT Assessment - 07/04/17 1301      Assessment   Diagnosis s/p ORIF for left distal radius and ulnar fracture     Precautions   Precautions None                  OT Treatments/Exercises (OP) - 07/04/17 1305      Exercises   Exercises Hand;Wrist     Wrist Exercises   Forearm Supination PROM;5 reps;AROM;10 reps   Forearm Pronation PROM;5 reps;AROM;10 reps   Wrist Flexion PROM;5 reps;AROM;10 reps   Wrist Extension PROM;5 reps;AROM;10 reps     Manual Therapy   Manual Therapy Myofascial release;Muscle Energy  Technique;Passive ROM   Manual therapy comments completed separately from therapeutic exercises   Myofascial Release Myofascial release to left dorsal hand, wrist, dorsal and volar forearms to decrease pain and fascial restrictions and increase joint range of motion.    Passive ROM Sustained stretching at end range of passive motion to decrease muscle spasms and increase ROM.    Muscle Energy Technique Muscle energy technique to wrist extensors to increase joint range of motion and decrease pain and muscle spasms                OT Education - 07/04/17 1356    Education provided Yes   Education Details A/ROM for wrist and forearm   Person(s) Educated Patient   Methods Explanation;Demonstration;Handout   Comprehension Verbalized understanding;Returned demonstration          OT Short Term Goals - 07/04/17 1406      OT SHORT TERM GOAL #1   Title Pt will be educated on HEP for improved use of LUE during B/IADL completion.    Time 3   Period Weeks   Status On-going     OT SHORT TERM GOAL #2   Title Pt will improve left hand/wrist P/ROM by 10 degrees to improve ability to use  LUE as assist during dressing tasks.    Time 3   Period Weeks   Status On-going           OT Long Term Goals - 07/04/17 1407      OT LONG TERM GOAL #1   Title Pt will improve A/ROM of left hand and wrist by 15 degrees to improve ability to use LUE as assist during bathing tasks.    Time 6   Period Weeks   Status On-going     OT LONG TERM GOAL #2   Title Pt will increase functional use of LUE by using left hand for ADL completion a minimum of 25% of the time.    Time 6   Period Weeks   Status On-going     OT LONG TERM GOAL #3   Title Pt will improve left grip strength by 5# and pinch strength by 2# to improve ability to grasp and maintain hold of lightweight items.    Time 6   Period Weeks   Status On-going     OT LONG TERM GOAL #4   Title Pt will improve left wrist strength to 3-/5 to  improve ability to use LUE as assist during meal preparation tasks.    Time 6   Period Weeks   Status On-going               Plan - 07/04/17 1400    Clinical Impression Statement A: Initiated myofascial release and manual therapy this session, A/ROM wrist and forearm exercises also initiated. Pt had good response to myofascial release throughout hand and wrist and with muscle energy technique to wrist extensors. Educated pt on potential length/duration of therapy, pt verbalized understanding. Provided HEP for active wrist/forearm exercises.    Plan P: Continue with myofascial release to wrist/forearm and dorsal hand working primarily on wrist ROM for now. Follow up on HEP.    OT Home Exercise Plan 10/2: wrist/forearm A/ROM   Consulted and Agree with Plan of Care Patient      Patient will benefit from skilled therapeutic intervention in order to improve the following deficits and impairments:  Increased muscle spasms, Decreased scar mobility, Impaired flexibility, Decreased strength, Decreased activity tolerance, Decreased mobility, Pain, Increased edema, Impaired tone, Decreased range of motion, Decreased coordination, Increased fascial restricitons, Impaired UE functional use  Visit Diagnosis: Stiffness of left wrist, not elsewhere classified  Stiffness of left hand, not elsewhere classified  Other symptoms and signs involving the musculoskeletal system    Problem List Patient Active Problem List   Diagnosis Date Noted  . Left carotid bruit 03/15/2013  . Aortic valve stenosis, mild 03/15/2013  . Essential hypertension   . Hypercholesteremia   . Coronary artery disease 09/02/2009   Guadelupe Sabin, OTR/L  [phone] 07/04/2017, 2:09 PM  [place] [address] [place], [place], [address] Phone: [phone]   Fax:  [phone]  Name: LANAH STEINES MRN: [id number] Date of Birth: [birth year]

## 2017-07-04 NOTE — Patient Instructions (Signed)
AROM Exercises: *Complete exercises ___10-15___ times each, __3_____ times per day*  1) Wrist Flexion  Start with wrist at edge of table, palm facing up. With wrist hanging slightly off table, curl wrist upward, and back down.      2) Wrist Extension  Start with wrist at edge of table, palm facing down. With wrist slightly off the edge of the table, curl wrist up and back down.       3) WRIST PRONATION  Turn your forearm towards palm face down.  Keep your elbow bent and by the side of your  Body.      4) WRIST SUPINATION  Turn your forearm towards palm face up.  Keep your elbow bent and by the side of your  Body.

## 2017-07-04 NOTE — Therapy (Signed)
[place], [place], [address] Phone: [phone]   Fax:  [phone]  Physical Therapy Treatment  Patient Details  Name: Ann Patel MRN: [id number] Date of Birth: 1946/03/31 Referring Provider: Corene Cornea Heggerick, PA-C  Encounter Date: 07/04/2017      PT End of Session - 07/04/17 1441    Visit Number 4   Number of Visits 13   Date for PT Re-Evaluation 07/12/17   Authorization Type [employer]   Authorization Time Period 06/21/17 to 08/02/17   Authorization - Visit Number 4   Authorization - Number of Visits 10   PT Start Time 6803   PT Stop Time 1523   PT Time Calculation (min) 48 min   Activity Tolerance Patient tolerated treatment well;No increased pain   Behavior During Therapy WFL for tasks assessed/performed      Past Medical History:  Diagnosis Date  . Coronary artery disease 09/2009   stress test EF 67%  . Hypercholesteremia   . Hypertension   . Myocardial infarction [facility])     Past Surgical History:  Procedure Laterality Date  . BREAST CYST EXCISION     fibro cyst removal  . CORONARY ANGIOPLASTY WITH STENT PLACEMENT Right 07/2005   receiving two consecutive drug eluting 2.5x50mm Taxus stents in the proximal to mid right coronary artery  . TONSILLECTOMY      There were no vitals filed for this visit.      Subjective Assessment - 07/04/17 1435    Subjective Pt entered dept ambulating with quad cane, no reoprts of pain just mainly stiffness in knee.  States most stiffness wiht sitting for long periods of time   Patient Stated Goals to get back like I was   Currently in Pain? No/denies                         [facility] Adult PT Treatment/Exercise - 07/04/17 1449                           Knee/Hip Exercises: Stretches   Active Hamstring Stretch Left;3 reps;30 seconds   Active Hamstring Stretch Limitations supine with rope   Knee: Self-Stretch to increase  Flexion Left;10 seconds   Knee: Self-Stretch Limitations 10 reps on 12" step   Gastroc Stretch 2 reps;30 seconds   Gastroc Stretch Limitations slant board     Knee/Hip Exercises: Standing   Heel Raises 15 reps   Heel Raises Limitations toerasies 10 reps   Terminal Knee Extension Limitations 10x 5" with RTB   Rocker Board 2 minutes   Rocker Board Limitations R/L   Gait Training Heel to toe pattern x 221ft with QC     Knee/Hip Exercises: Seated   Long Arc Quad 15 reps     Knee/Hip Exercises: Supine   Quad Sets 15 reps   Quad Sets Limitations tactile and verbal cueing to improve activatino   Short Arc [employer] 15 reps   Short Arc Quad Sets Limitations 3" holds   Heel Slides 10 reps   Heel Slides Limitations 10sec hold with AAROM at end range   Knee Extension Limitations 14 degrees lacking   Knee Flexion Limitations 106 degrees AAROM with sheet                  PT Short Term Goals - 06/21/17 1452      PT SHORT TERM GOAL #  1   Title Pt will be independent with HEP and perform consistently to maximize return to PLOF.   Time 3   Period Weeks   Status New   Target Date 07/12/17     PT SHORT TERM GOAL #2   Title Pt will have improved L knee AROM at least 5-110 to maximize gait.    Time 3   Period Weeks   Status New     PT SHORT TERM GOAL #3   Title Pt will have improved 5xSTS to <10 sec with proper mechanics and weight shift over LLE to demo improved functional strength.   Time 3   Period Weeks   Status New           PT Long Term Goals - 06/21/17 1455      PT LONG TERM GOAL #1   Title Pt will have improved L knee AROM to 0-120 to maximize gait and return to PLOF.   Time 6   Period Weeks   Status New   Target Date 08/02/17     PT LONG TERM GOAL #2   Title Pt will have improved TUG to 10 sec or < with no AD to demo improved gait and community participation.   Time 6   Period Weeks   Status New     PT LONG TERM GOAL #3   Title Pt will have improved  SLS on L to at least 15 sec with min to no unsteadiness to demo improved balance and maximize gait on uneven ground   Time 6   Period Weeks   Status New     PT LONG TERM GOAL #4   Title Pt will have improved 3MWT to 682ft or > with LRAD and with proper gait mechanics to demo improved overall function.    Time 6   Period Weeks   Status New     PT LONG TERM GOAL #5   Title Pt will report being able to return to her regular walking program at least 3x/week to demo improved tolerance to activity, gait, balance, and strength.    Time 6   Period Weeks   Status New               Plan - 07/04/17 1456    Clinical Impression Statement Increased session focus iwht knee mobility.  Pt does continue to demonstrate minimal quadricp activation with knee extension activities. Added SAQ/LAQ and standing TKE to improve quad activation and increased hold time for strengthening.  Cueing for heel to toe pattern with gait to improve knee mobility with gait and added rocker board to improve equalize weight distribution.  AROM 14-106 degrees.  No reoprts of pain through session.   Rehab Potential Good   PT Frequency 2x / week   PT Duration 6 weeks   PT Treatment/Interventions ADLs/Self Care Home Management;Cryotherapy;Electrical Stimulation;Moist Heat;Ultrasound;DME Instruction;Gait training;Stair training;Functional mobility training;Therapeutic activities;Therapeutic exercise;Balance training;Neuromuscular re-education;Patient/family education;Manual techniques;Scar mobilization;Passive range of motion;Dry needling;Energy conservation;Taping   PT Next Visit Plan Continue to improve functional strengthening, knee AROM, balance traning and manual techniques for edema and joint mobilization    PT Home Exercise Plan eval: quad sets, heel slides      Patient will benefit from skilled therapeutic intervention in order to improve the following deficits and impairments:  Abnormal gait, Decreased activity  tolerance, Decreased balance, Decreased endurance, Decreased mobility, Decreased range of motion, Decreased strength, Difficulty walking, Hypomobility, Increased fascial restricitons, Increased muscle spasms, Impaired flexibility, Impaired UE functional  use, Improper body mechanics  Visit Diagnosis: Stiffness of right knee, not elsewhere classified  Muscle weakness (generalized)  Difficulty in walking, not elsewhere classified     Problem List Patient Active Problem List   Diagnosis Date Noted  . Left carotid bruit 03/15/2013  . Aortic valve stenosis, mild 03/15/2013  . Essential hypertension   . Hypercholesteremia   . Coronary artery disease 09/02/2009   Ihor [place], [place]; [place]  Aldona Lento 07/04/2017, 4:38 PM  [place] [address] [place], [place], [address] Phone: [phone]   Fax:  [phone]  Name: Ann Patel MRN: [id number] Date of Birth: 23-Dec-1945

## 2017-07-06 ENCOUNTER — Ambulatory Visit (HOSPITAL_COMMUNITY): Payer: Commercial Managed Care - HMO | Admitting: Occupational Therapy

## 2017-07-06 ENCOUNTER — Encounter (HOSPITAL_COMMUNITY): Payer: Self-pay

## 2017-07-06 ENCOUNTER — Ambulatory Visit (HOSPITAL_COMMUNITY): Payer: Commercial Managed Care - HMO

## 2017-07-06 ENCOUNTER — Encounter (HOSPITAL_COMMUNITY): Payer: Self-pay | Admitting: Occupational Therapy

## 2017-07-06 DIAGNOSIS — M25661 Stiffness of right knee, not elsewhere classified: Secondary | ICD-10-CM | POA: Diagnosis not present

## 2017-07-06 DIAGNOSIS — M25632 Stiffness of left wrist, not elsewhere classified: Secondary | ICD-10-CM

## 2017-07-06 DIAGNOSIS — M6281 Muscle weakness (generalized): Secondary | ICD-10-CM

## 2017-07-06 DIAGNOSIS — R29898 Other symptoms and signs involving the musculoskeletal system: Secondary | ICD-10-CM | POA: Diagnosis not present

## 2017-07-06 DIAGNOSIS — M25642 Stiffness of left hand, not elsewhere classified: Secondary | ICD-10-CM

## 2017-07-06 DIAGNOSIS — R262 Difficulty in walking, not elsewhere classified: Secondary | ICD-10-CM

## 2017-07-06 NOTE — Therapy (Signed)
[place], [place], [address] Phone: [phone]   Fax:  [phone]  Physical Therapy Treatment  Patient Details  Name: Ann Patel MRN: [id number] Date of Birth: 27-Nov-1945 Referring Provider: Corene Cornea Heggerick, PA-C  Encounter Date: 07/06/2017      PT End of Session - 07/06/17 1348    Visit Number 5   Number of Visits 13   Date for PT Re-Evaluation 07/12/17   Authorization Type [employer]   Authorization Time Period 06/21/17 to 08/02/17   Authorization - Visit Number 5   Authorization - Number of Visits 10   PT Start Time 1300   PT Stop Time 1345   PT Time Calculation (min) 45 min   Equipment Utilized During Treatment Gait belt   Activity Tolerance Patient tolerated treatment well;No increased pain   Behavior During Therapy WFL for tasks assessed/performed      Past Medical History:  Diagnosis Date  . Coronary artery disease 09/2009   stress test EF 67%  . Hypercholesteremia   . Hypertension   . Myocardial infarction [facility])     Past Surgical History:  Procedure Laterality Date  . BREAST CYST EXCISION     fibro cyst removal  . CORONARY ANGIOPLASTY WITH STENT PLACEMENT Right 07/2005   receiving two consecutive drug eluting 2.5x47mm Taxus stents in the proximal to mid right coronary artery  . TONSILLECTOMY      There were no vitals filed for this visit.      Subjective Assessment - 07/06/17 1304    Subjective Pt came to today's session utilizing quad cane with no new reports. She notes continued stiffness.                         [facility] Adult PT Treatment/Exercise - 07/06/17 1309      Knee/Hip Exercises: Standing   Heel Raises 15 reps   Heel Raises Limitations toerasies 10 reps   Terminal Knee Extension Limitations 10x 5" with RTB     Knee/Hip Exercises: Seated   Long Arc Quad 15 reps     Knee/Hip Exercises: Supine   Quad Sets 15 reps   Quad Sets  Limitations Tactile and verbal cue for activation   Short Arc [employer] 15 reps   Short Arc Quad Sets Limitations 3" hold   Heel Slides 10 reps   Heel Slides Limitations 10sec hold with AAROM at end range   Knee Extension Limitations 15 degrees lacking   Knee Flexion Limitations 106 degrees AAROM therapist     Manual Therapy   Manual Therapy Passive ROM;Joint mobilization   Manual therapy comments completed separately from therapeutic exercises   Joint Mobilization patella mobilization medial resetriction and superior/inferior restricted   Passive ROM Sustained stretching at end range of passive motion to decrease muscle spasms and increase ROM.                 PT Education - 07/06/17 1347    Education provided Yes   Education Details home ROM options for knee flexion    Person(s) Educated Patient   Methods Explanation;Demonstration;Handout   Comprehension Verbalized understanding          PT Short Term Goals - 06/21/17 1452      PT SHORT TERM GOAL #1   Title Pt will be independent with HEP and perform consistently to maximize return to PLOF.   Time 3   Period Weeks  Status New   Target Date 07/12/17     PT SHORT TERM GOAL #2   Title Pt will have improved L knee AROM at least 5-110 to maximize gait.    Time 3   Period Weeks   Status New     PT SHORT TERM GOAL #3   Title Pt will have improved 5xSTS to <10 sec with proper mechanics and weight shift over LLE to demo improved functional strength.   Time 3   Period Weeks   Status New           PT Long Term Goals - 06/21/17 1455      PT LONG TERM GOAL #1   Title Pt will have improved L knee AROM to 0-120 to maximize gait and return to PLOF.   Time 6   Period Weeks   Status New   Target Date 08/02/17     PT LONG TERM GOAL #2   Title Pt will have improved TUG to 10 sec or < with no AD to demo improved gait and community participation.   Time 6   Period Weeks   Status New     PT LONG TERM GOAL #3    Title Pt will have improved SLS on L to at least 15 sec with min to no unsteadiness to demo improved balance and maximize gait on uneven ground   Time 6   Period Weeks   Status New     PT LONG TERM GOAL #4   Title Pt will have improved 3MWT to 658ft or > with LRAD and with proper gait mechanics to demo improved overall function.    Time 6   Period Weeks   Status New     PT LONG TERM GOAL #5   Title Pt will report being able to return to her regular walking program at least 3x/week to demo improved tolerance to activity, gait, balance, and strength.    Time 6   Period Weeks   Status New               Plan - 07/06/17 1349    Clinical Impression Statement Continued focus on Lt knee mobility this session. Pt presented with maintanence of AROM 15-106 degrees with manual over pressure and maintained stretching. She presents with quadricep activtion difficulty with quad set that improves with SAQ/LAQ activities. Pt inquired about a brace to help gain back knee flexion mobility and was encouraged to discuss with physician and continued monitor will occur in sessions as well in regards to motion progression. If progression does not continue it's my professional opinion she should follow up with referring prvider.    Rehab Potential Good   PT Frequency 2x / week   PT Duration 6 weeks   PT Treatment/Interventions ADLs/Self Care Home Management;Cryotherapy;Electrical Stimulation;Moist Heat;Ultrasound;DME Instruction;Gait training;Stair training;Functional mobility training;Therapeutic activities;Therapeutic exercise;Balance training;Neuromuscular re-education;Patient/family education;Manual techniques;Scar mobilization;Passive range of motion;Dry needling;Energy conservation;Taping   PT Next Visit Plan Continue to improve functional strengthening, knee AROM, balance traning and manual techniques for edema and joint mobilization    PT Home Exercise Plan eval: quad sets, heel slides; 07/06/17  seated knee flexion sustained    Consulted and Agree with Plan of Care Patient      Patient will benefit from skilled therapeutic intervention in order to improve the following deficits and impairments:  Abnormal gait, Decreased activity tolerance, Decreased balance, Decreased endurance, Decreased mobility, Decreased range of motion, Decreased strength, Difficulty walking, Hypomobility, Increased fascial restricitons, Increased muscle  spasms, Impaired flexibility, Impaired UE functional use, Improper body mechanics  Visit Diagnosis: Stiffness of right knee, not elsewhere classified  Muscle weakness (generalized)  Difficulty in walking, not elsewhere classified     Problem List Patient Active Problem List   Diagnosis Date Noted  . Left carotid bruit 03/15/2013  . Aortic valve stenosis, mild 03/15/2013  . Essential hypertension   . Hypercholesteremia   . Coronary artery disease 09/02/2009   [place]arr [place] PT, DPT 1:55 PM, 07/06/17 [place] [address] [place], [place], [address] Phone: [phone]   Fax:  [phone]  Name: BOBETTE LEYH MRN: [id number] Date of Birth: 1946/08/04

## 2017-07-06 NOTE — Therapy (Signed)
[place], [place], [address] Phone: [phone]   Fax:  [phone]  Occupational Therapy Treatment  Patient Details  Name: Ann Patel MRN: [id number] Date of Birth: [birth year] Referring Provider: Corene Cornea Heggerick, Pa-C  Encounter Date: 07/06/2017      OT End of Session - 07/06/17 1213    Visit Number 3   Number of Visits 18   Date for OT Re-Evaluation 08/04/17   Authorization Type [employer] Insurance being billed; pt is bringing information   OT Start Time 1119   OT Stop Time 1201   OT Time Calculation (min) 42 min   Activity Tolerance Patient tolerated treatment well   Behavior During Therapy [facility] for tasks assessed/performed      Past Medical History:  Diagnosis Date  . Coronary artery disease 09/2009   stress test EF 67%  . Hypercholesteremia   . Hypertension   . Myocardial infarction [facility])     Past Surgical History:  Procedure Laterality Date  . BREAST CYST EXCISION     fibro cyst removal  . CORONARY ANGIOPLASTY WITH STENT PLACEMENT Right 07/2005   receiving two consecutive drug eluting 2.5x43mm Taxus stents in the proximal to mid right coronary artery  . TONSILLECTOMY      There were no vitals filed for this visit.      Subjective Assessment - 07/06/17 1120    Subjective  S: I did those exercises last night.    Currently in Pain? No/denies            [facility] OT Assessment - 07/06/17 1120      Assessment   Diagnosis s/p ORIF for left distal radius and ulnar fracture     Precautions   Precautions None                  OT Treatments/Exercises (OP) - 07/06/17 1120      Exercises   Exercises Hand;Wrist     Wrist Exercises   Forearm Supination PROM;5 reps;AROM;15 reps   Forearm Pronation PROM;5 reps;AROM;15 reps   Wrist Flexion PROM;5 reps;AROM;15 reps   Wrist Extension PROM;5 reps;AROM;15 reps   Wrist Radial Deviation PROM;5 reps;AROM;15 reps   Wrist Ulnar  Deviation PROM;5 reps;AROM;15 reps     Manual Therapy   Manual Therapy Myofascial release;Muscle Energy Technique;Passive ROM   Manual therapy comments completed separately from therapeutic exercises   Myofascial Release Myofascial release to left dorsal hand, wrist, dorsal and volar forearms to decrease pain and fascial restrictions and increase joint range of motion.    Passive ROM Sustained stretching at end range of passive motion to decrease muscle spasms and increase ROM.    Muscle Energy Technique Muscle energy technique to wrist extensors to increase joint range of motion and decrease pain and muscle spasms                  OT Short Term Goals - 07/04/17 1406      OT SHORT TERM GOAL #1   Title Pt will be educated on HEP for improved use of LUE during B/IADL completion.    Time 3   Period Weeks   Status On-going     OT SHORT TERM GOAL #2   Title Pt will improve left hand/wrist P/ROM by 10 degrees to improve ability to use LUE as assist during dressing tasks.    Time 3   Period Weeks   Status On-going  OT Long Term Goals - 07/04/17 1407      OT LONG TERM GOAL #1   Title Pt will improve A/ROM of left hand and wrist by 15 degrees to improve ability to use LUE as assist during bathing tasks.    Time 6   Period Weeks   Status On-going     OT LONG TERM GOAL #2   Title Pt will increase functional use of LUE by using left hand for ADL completion a minimum of 25% of the time.    Time 6   Period Weeks   Status On-going     OT LONG TERM GOAL #3   Title Pt will improve left grip strength by 5# and pinch strength by 2# to improve ability to grasp and maintain hold of lightweight items.    Time 6   Period Weeks   Status On-going     OT LONG TERM GOAL #4   Title Pt will improve left wrist strength to 3-/5 to improve ability to use LUE as assist during meal preparation tasks.    Time 6   Period Weeks   Status On-going               Plan -  07/06/17 1215    Clinical Impression Statement A: Continued with myofascial release and manual therapy, passive stretching and A/ROM. Pt demonstrates slight improvement in range with supination this session, added ulnar and radial deviation today as well. Pt reports she is completing her HEP and it is going well.    Plan P: continue with manual therapy to wrist/forearm and hand, primarily focusing on wrist. Add weighted stretch for extension and supination   OT Home Exercise Plan 10/2: wrist/forearm A/ROM   Consulted and Agree with Plan of Care Patient      Patient will benefit from skilled therapeutic intervention in order to improve the following deficits and impairments:  Increased muscle spasms, Decreased scar mobility, Impaired flexibility, Decreased strength, Decreased activity tolerance, Decreased mobility, Pain, Increased edema, Impaired tone, Decreased range of motion, Decreased coordination, Increased fascial restricitons, Impaired UE functional use  Visit Diagnosis: Stiffness of left wrist, not elsewhere classified  Stiffness of left hand, not elsewhere classified  Other symptoms and signs involving the musculoskeletal system    Problem List Patient Active Problem List   Diagnosis Date Noted  . Left carotid bruit 03/15/2013  . Aortic valve stenosis, mild 03/15/2013  . Essential hypertension   . Hypercholesteremia   . Coronary artery disease 09/02/2009   Guadelupe Sabin, OTR/L  [phone] 07/06/2017, 12:18 PM  [place] [address] [place], [place], [address] Phone: [phone]   Fax:  [phone]  Name: Ann Patel MRN: [id number] Date of Birth: 1946/06/08

## 2017-07-06 NOTE — Patient Instructions (Signed)
  SEATED KNEE FLEXION STRETCH SCOOT  While in a seated position, slides your foot back to a bent knee position. Keep your foot planted on the ground and scoot forward until a stretch is felt at the knee.   5 second hold, 10 reps

## 2017-07-07 DIAGNOSIS — I2510 Atherosclerotic heart disease of native coronary artery without angina pectoris: Secondary | ICD-10-CM | POA: Diagnosis not present

## 2017-07-07 DIAGNOSIS — E042 Nontoxic multinodular goiter: Secondary | ICD-10-CM | POA: Diagnosis not present

## 2017-07-07 DIAGNOSIS — M1991 Primary osteoarthritis, unspecified site: Secondary | ICD-10-CM | POA: Diagnosis not present

## 2017-07-07 DIAGNOSIS — E782 Mixed hyperlipidemia: Secondary | ICD-10-CM | POA: Diagnosis not present

## 2017-07-07 DIAGNOSIS — Z6821 Body mass index (BMI) 21.0-21.9, adult: Secondary | ICD-10-CM | POA: Diagnosis not present

## 2017-07-07 DIAGNOSIS — E041 Nontoxic single thyroid nodule: Secondary | ICD-10-CM | POA: Diagnosis not present

## 2017-07-07 DIAGNOSIS — E538 Deficiency of other specified B group vitamins: Secondary | ICD-10-CM | POA: Diagnosis not present

## 2017-07-07 DIAGNOSIS — I10 Essential (primary) hypertension: Secondary | ICD-10-CM | POA: Diagnosis not present

## 2017-07-10 ENCOUNTER — Ambulatory Visit (HOSPITAL_COMMUNITY): Payer: Commercial Managed Care - HMO

## 2017-07-10 ENCOUNTER — Encounter (HOSPITAL_COMMUNITY): Payer: Self-pay

## 2017-07-10 DIAGNOSIS — R29898 Other symptoms and signs involving the musculoskeletal system: Secondary | ICD-10-CM | POA: Diagnosis not present

## 2017-07-10 DIAGNOSIS — M25642 Stiffness of left hand, not elsewhere classified: Secondary | ICD-10-CM

## 2017-07-10 DIAGNOSIS — M25632 Stiffness of left wrist, not elsewhere classified: Secondary | ICD-10-CM | POA: Diagnosis not present

## 2017-07-10 DIAGNOSIS — M6281 Muscle weakness (generalized): Secondary | ICD-10-CM | POA: Diagnosis not present

## 2017-07-10 DIAGNOSIS — R262 Difficulty in walking, not elsewhere classified: Secondary | ICD-10-CM | POA: Diagnosis not present

## 2017-07-10 DIAGNOSIS — M25661 Stiffness of right knee, not elsewhere classified: Secondary | ICD-10-CM | POA: Diagnosis not present

## 2017-07-11 ENCOUNTER — Ambulatory Visit (HOSPITAL_COMMUNITY): Payer: Commercial Managed Care - HMO

## 2017-07-11 DIAGNOSIS — R29898 Other symptoms and signs involving the musculoskeletal system: Secondary | ICD-10-CM

## 2017-07-11 DIAGNOSIS — R262 Difficulty in walking, not elsewhere classified: Secondary | ICD-10-CM

## 2017-07-11 DIAGNOSIS — M6281 Muscle weakness (generalized): Secondary | ICD-10-CM

## 2017-07-11 DIAGNOSIS — M25661 Stiffness of right knee, not elsewhere classified: Secondary | ICD-10-CM | POA: Diagnosis not present

## 2017-07-11 DIAGNOSIS — M25642 Stiffness of left hand, not elsewhere classified: Secondary | ICD-10-CM | POA: Diagnosis not present

## 2017-07-11 DIAGNOSIS — M25632 Stiffness of left wrist, not elsewhere classified: Secondary | ICD-10-CM | POA: Diagnosis not present

## 2017-07-11 NOTE — Therapy (Signed)
[place], [place], [address] Phone: [phone]   Fax:  [phone]  Physical Therapy Treatment  Patient Details  Name: Ann Patel MRN: [id number] Date of Birth: 1946/08/05 Referring Provider: Corene Cornea Heggerick, PA-C  Encounter Date: 07/11/2017      PT End of Session - 07/11/17 1525    Visit Number 6   Number of Visits 13   Date for PT Re-Evaluation 08/02/17   Authorization Type [employer] (g-codes done on 11/12/2022 visit)   Authorization Time Period 06/21/17 to 08/02/17   Authorization - Visit Number 6   Authorization - Number of Visits 13   PT Start Time 0964   PT Stop Time 1603   PT Time Calculation (min) 40 min   Equipment Utilized During Treatment Gait belt   Activity Tolerance Patient tolerated treatment well;No increased pain   Behavior During Therapy WFL for tasks assessed/performed      Past Medical History:  Diagnosis Date  . Coronary artery disease 09/2009   stress test EF 67%  . Hypercholesteremia   . Hypertension   . Myocardial infarction [facility])     Past Surgical History:  Procedure Laterality Date  . BREAST CYST EXCISION     fibro cyst removal  . CORONARY ANGIOPLASTY WITH STENT PLACEMENT Right 07/2005   receiving two consecutive drug eluting 2.5x63m Taxus stents in the proximal to mid right coronary artery  . TONSILLECTOMY      There were no vitals filed for this visit.      Subjective Assessment - 07/11/17 1525    Subjective Pt states that she is not having any pain currently. Her HEP is going well.   Currently in Pain? No/denies            [facility] - [facility]      AROM   Left Knee Extension 12  was 20   Left Knee Flexion 110  was 98   Left Ankle Dorsiflexion -3  was -7   Left Ankle Plantar Flexion 45  was 45     Ambulation/Gait   Ambulation Distance (Feet) 738 Feet  3MWT   Assistive device None   Gait Pattern Step-through  pattern;Decreased step length - right;Decreased stance time - left;Decreased dorsiflexion - left  increased post pelvic rot on L, L toe in, general decreased      Balance   Balance Assessed Yes     Static Standing Balance   Static Standing - Balance Support No upper extremity supported   Static Standing Balance -  Activities  Single Leg Stance - Left Leg   Static Standing - Comment/# of Minutes L:16 sec     Standardized Balance Assessment   Standardized Balance Assessment Five Times Sit to Stand;Timed Up and Go Test   Five times sit to stand comments  8.5 sec from chair, good mechanics              [facility] Adult PT Treatment/Exercise - 07/11/17 0001      Knee/Hip Exercises: Stretches   Knee: Self-Stretch to increase Flexion Left;3 reps;10 seconds   Gastroc Stretch 2 reps;30 seconds   Gastroc Stretch Limitations on step   Other Knee/Hip Stretches supine prolonged knee extension stretch education for HEP             PT Education - 07/11/17 1550    Education provided Yes   Education Details reassessment findings, HEP, POC   Person(s) Educated Patient  Methods Explanation;Demonstration   Comprehension Verbalized understanding;Returned demonstration          PT Short Term Goals - 07/11/17 1526      PT SHORT TERM GOAL #1   Title Pt will be independent with HEP and perform consistently to maximize return to PLOF.   Time 3   Period Weeks   Status Achieved     PT SHORT TERM GOAL #2   Title Pt will have improved L knee AROM at least 5-110 to maximize gait.    Baseline 10/9: 12-110   Time 3   Period Weeks   Status Partially Met     PT SHORT TERM GOAL #3   Title Pt will have improved 5xSTS to <10 sec with proper mechanics and weight shift over LLE to demo improved functional strength.   Time 3   Period Weeks   Status Achieved           PT Long Term Goals - 07/11/17 1526      PT LONG TERM GOAL #1   Title Pt will have improved L knee AROM to 0-120 to  maximize gait and return to PLOF.   Baseline 10/9: 12-110   Time 6   Period Weeks   Status On-going     PT LONG TERM GOAL #2   Title Pt will have improved TUG to 10 sec or < with no AD to demo improved gait and community participation.   Baseline 10/9: 8.3 sec no AD, but with increased gait deviations   Time 6   Period Weeks   Status Partially Met     PT LONG TERM GOAL #3   Title Pt will have improved SLS on L to at least 15 sec with min to no unsteadiness to demo improved balance and maximize gait on uneven ground   Baseline 10/9: 16 sec LLE    Time 6   Period Weeks   Status Achieved     PT LONG TERM GOAL #4   Title Pt will have improved 3MWT to 633f or > with LRAD and with proper gait mechanics to demo improved overall function.    Baseline 10/9: 7370f no AD but with increased gait mechanics   Time 6   Period Weeks   Status Partially Met     PT LONG TERM GOAL #5   Title Pt will report being able to return to her regular walking program at least 3x/week to demo improved tolerance to activity, gait, balance, and strength.    Time 6   Period Weeks   Status Achieved               Plan - 07/11/17 1606    Clinical Impression Statement PT reassessed pt's goals and outcome measures this date. Pt has made good progress towards goals as illustrated above. Her AROM is steadily improving, though she is still lacking in both extension and flexion. Her overall functional strength has improved as evidenced by her 5xSTS and her overall balance has siginficantly improved as she was able to perform SLS on her LLE for >15 sec. She's also been able to ambulate without her cane more indicating improved functional strength and balance. Her gait pattern is still deficient, mostly due to her limited knee ROM. Overall, pt making good progress and POC should be continued as planned. HEP was updated this date.    Rehab Potential Good   PT Frequency 2x / week   PT Duration 6 weeks   PT  Treatment/Interventions ADLs/Self Care Home Management;Cryotherapy;Electrical Stimulation;Moist Heat;Ultrasound;DME Instruction;Gait training;Stair training;Functional mobility training;Therapeutic activities;Therapeutic exercise;Balance training;Neuromuscular re-education;Patient/family education;Manual techniques;Scar mobilization;Passive range of motion;Dry needling;Energy conservation;Taping   PT Next Visit Plan Continue to improve functional strengthening, knee AROM, balance traning and manual techniques for edema and joint mobilization; contract-relax for knee flexion, quad sets with OP   PT Home Exercise Plan eval: quad sets, heel slides; 07/06/17 seated knee flexion sustained; 07/14/2023: standing calf stretch on step, knee drives on step, supine prolonged knee extension stretch with weight   Consulted and Agree with Plan of Care Patient      Patient will benefit from skilled therapeutic intervention in order to improve the following deficits and impairments:  Abnormal gait, Decreased activity tolerance, Decreased balance, Decreased endurance, Decreased mobility, Decreased range of motion, Decreased strength, Difficulty walking, Hypomobility, Increased fascial restricitons, Increased muscle spasms, Impaired flexibility, Impaired UE functional use, Improper body mechanics  Visit Diagnosis: Stiffness of right knee, not elsewhere classified  Muscle weakness (generalized)  Difficulty in walking, not elsewhere classified  Other symptoms and signs involving the musculoskeletal system       G-Codes - Jul 13, 2017 1618    Functional Assessment Tool Used (Outpatient Only) clinical judgement, 5xSTS, SLS, TUG, 3MWT, AROM   Functional Limitation Mobility: Walking and moving around   Mobility: Walking and Moving Around Current Status [phone]) At least 20 percent but less than 40 percent impaired, limited or restricted   Mobility: Walking and Moving Around Goal Status [phone]) At least 1 percent but less than  20 percent impaired, limited or restricted      Problem List Patient Active Problem List   Diagnosis Date Noted  . Left carotid bruit 03/15/2013  . Aortic valve stenosis, mild 03/15/2013  . Essential hypertension   . Hypercholesteremia   . Coronary artery disease 09/02/2009        Geraldine Solar PT, DPT  [place] [address] [place], [place], [address] Phone: [phone]   Fax:  [phone]  Name: BONI MACLELLAN MRN: [id number] Date of Birth: 23-Oct-1945

## 2017-07-11 NOTE — Therapy (Signed)
[place], [place], [address] Phone: [phone]   Fax:  [phone]  Occupational Therapy Treatment  Patient Details  Name: Ann Patel MRN: [id number] Date of Birth: Jul 24, 1946 Referring Provider: Corene Cornea Heggerick, Pa-C  Encounter Date: 07/10/2017      OT End of Session - 07/11/17 0814    Visit Number 4   Number of Visits 18   Date for OT Re-Evaluation 08/04/17   Authorization Type [employer] Insurance being billed; pt is bringing information   OT Start Time 94  Pt arrived late   OT Stop Time 1603   OT Time Calculation (min) 33 min   Activity Tolerance Patient tolerated treatment well   Behavior During Therapy [facility] for tasks assessed/performed      Past Medical History:  Diagnosis Date  . Coronary artery disease 09/2009   stress test EF 67%  . Hypercholesteremia   . Hypertension   . Myocardial infarction [facility])     Past Surgical History:  Procedure Laterality Date  . BREAST CYST EXCISION     fibro cyst removal  . CORONARY ANGIOPLASTY WITH STENT PLACEMENT Right 07/2005   receiving two consecutive drug eluting 2.5x99mm Taxus stents in the proximal to mid right coronary artery  . TONSILLECTOMY      There were no vitals filed for this visit.      Subjective Assessment - 07/10/17 1556    Subjective  S: Last night I had a lot of pain but today I'm fine.   Currently in Pain? No/denies            [facility] OT Assessment - 07/10/17 1557      Assessment   Diagnosis s/p ORIF for left distal radius and ulnar fracture     Precautions   Precautions None                  OT Treatments/Exercises (OP) - 07/10/17 1557      Exercises   Exercises Hand;Wrist     Weighted Stretch Over Towel Roll   Supination - Weighted Stretch 2 pounds;60 seconds  2 sets   Wrist Extension - Weighted Stretch 2 pounds;60 seconds  2 sets     Wrist Exercises   Forearm Supination PROM;5 reps   Forearm  Pronation PROM;5 reps   Wrist Flexion PROM;5 reps   Wrist Extension PROM;5 reps   Wrist Radial Deviation PROM;5 reps   Wrist Ulnar Deviation PROM;5 reps     Manual Therapy   Manual Therapy Muscle Energy Technique;Myofascial release   Manual therapy comments completed separately from therapeutic exercises   Myofascial Release Myofascial release to left dorsal hand, wrist, dorsal and volar forearms to decrease pain and fascial restrictions and increase joint range of motion.    Muscle Energy Technique Muscle energy technique to wrist extensors to increase joint range of motion and decrease pain and muscle spasms                  OT Short Term Goals - 07/04/17 1406      OT SHORT TERM GOAL #1   Title Pt will be educated on HEP for improved use of LUE during B/IADL completion.    Time 3   Period Weeks   Status On-going     OT SHORT TERM GOAL #2   Title Pt will improve left hand/wrist P/ROM by 10 degrees to improve ability to use LUE as assist during dressing tasks.    Time 3  Period Weeks   Status On-going           OT Long Term Goals - 07/04/17 1407      OT LONG TERM GOAL #1   Title Pt will improve A/ROM of left hand and wrist by 15 degrees to improve ability to use LUE as assist during bathing tasks.    Time 6   Period Weeks   Status On-going     OT LONG TERM GOAL #2   Title Pt will increase functional use of LUE by using left hand for ADL completion a minimum of 25% of the time.    Time 6   Period Weeks   Status On-going     OT LONG TERM GOAL #3   Title Pt will improve left grip strength by 5# and pinch strength by 2# to improve ability to grasp and maintain hold of lightweight items.    Time 6   Period Weeks   Status On-going     OT LONG TERM GOAL #4   Title Pt will improve left wrist strength to 3-/5 to improve ability to use LUE as assist during meal preparation tasks.    Time 6   Period Weeks   Status On-going               Plan -  07/11/17 0817    Clinical Impression Statement A: Added weighted stretch for wrist extension and supination. patient had increased difficulty with wrist extension stretch mainly due to limited mobility. Weighted supination stretch showed good results. Patient was able to tolerate both and required VC for form and technique.   Plan P: Continue with manual therapy to wrist/forearm and hand, with focus on wrist mobility.       Patient will benefit from skilled therapeutic intervention in order to improve the following deficits and impairments:  Increased muscle spasms, Decreased scar mobility, Impaired flexibility, Decreased strength, Decreased activity tolerance, Decreased mobility, Pain, Increased edema, Impaired tone, Decreased range of motion, Decreased coordination, Increased fascial restricitons, Impaired UE functional use  Visit Diagnosis: Stiffness of left wrist, not elsewhere classified  Stiffness of left hand, not elsewhere classified  Other symptoms and signs involving the musculoskeletal system    Problem List Patient Active Problem List   Diagnosis Date Noted  . Left carotid bruit 03/15/2013  . Aortic valve stenosis, mild 03/15/2013  . Essential hypertension   . Hypercholesteremia   . Coronary artery disease 09/02/2009   Ailene Ravel, OTR/L,CBIS  [phone]  07/11/2017, 8:19 AM  [place] [address] [place], [place], [address] Phone: [phone]   Fax:  [phone]  Name: Ann Patel MRN: [id number] Date of Birth: 06/25/1946

## 2017-07-11 NOTE — Patient Instructions (Signed)
  Gastrocnemius Stretch Off Step  Standing with the ball of your foot on a step or a stoop with your leg and knee straight, allow your heel to slowly lower down off the step until you feel a stretch on the back of your calf. Hold this stretch for 30 seconds.  Perform 2x/day, 3-5 stretches holding for 30-60 seconds   Dorsiflexion self- mobilization  Place affected foot on step and other on the ground. With the foot placed firmly on the step, drive the knee forward out over the foot.   Perform 1-2x/day, 10-15 reps holding for 10-15 seconds   Knee extension stretch  Lie on your back and place a 5-10# ankle weight over your knee while your heel is propped up on a towel roll.  Lie there for 10-15 minutes or whatever you can tolerate.  Try for 2x/day

## 2017-07-13 ENCOUNTER — Ambulatory Visit (HOSPITAL_COMMUNITY): Payer: Commercial Managed Care - HMO

## 2017-07-13 ENCOUNTER — Encounter (HOSPITAL_COMMUNITY): Payer: Self-pay

## 2017-07-13 ENCOUNTER — Ambulatory Visit (HOSPITAL_COMMUNITY): Payer: Commercial Managed Care - HMO | Admitting: Physical Therapy

## 2017-07-13 DIAGNOSIS — M25661 Stiffness of right knee, not elsewhere classified: Secondary | ICD-10-CM | POA: Diagnosis not present

## 2017-07-13 DIAGNOSIS — R29898 Other symptoms and signs involving the musculoskeletal system: Secondary | ICD-10-CM

## 2017-07-13 DIAGNOSIS — M25642 Stiffness of left hand, not elsewhere classified: Secondary | ICD-10-CM

## 2017-07-13 DIAGNOSIS — M6281 Muscle weakness (generalized): Secondary | ICD-10-CM | POA: Diagnosis not present

## 2017-07-13 DIAGNOSIS — R262 Difficulty in walking, not elsewhere classified: Secondary | ICD-10-CM

## 2017-07-13 DIAGNOSIS — M25632 Stiffness of left wrist, not elsewhere classified: Secondary | ICD-10-CM

## 2017-07-13 NOTE — Therapy (Signed)
[place], [place], [address] Phone: [phone]   Fax:  [phone]  Occupational Therapy Treatment  Patient Details  Name: Ann Patel MRN: [id number] Date of Birth: [birth year] Referring Provider: Corene Cornea Heggerick, Pa-C  Encounter Date: 07/13/2017      OT End of Session - 07/13/17 1426    Visit Number 5   Number of Visits 18   Date for OT Re-Evaluation 08/04/17   Authorization Type [employer] Insurance being billed; pt is bringing information   OT Start Time 1350   OT Stop Time 1430   OT Time Calculation (min) 40 min   Activity Tolerance Patient tolerated treatment well   Behavior During Therapy [facility] for tasks assessed/performed      Past Medical History:  Diagnosis Date  . Coronary artery disease 09/2009   stress test EF 67%  . Hypercholesteremia   . Hypertension   . Myocardial infarction [facility])     Past Surgical History:  Procedure Laterality Date  . BREAST CYST EXCISION     fibro cyst removal  . CORONARY ANGIOPLASTY WITH STENT PLACEMENT Right 07/2005   receiving two consecutive drug eluting 2.5x85mm Taxus stents in the proximal to mid right coronary artery  . TONSILLECTOMY      There were no vitals filed for this visit.      Subjective Assessment - 07/13/17 1423    Subjective  S: Nothing hurts today.   Currently in Pain? No/denies            [facility] OT Assessment - 07/13/17 1424      Assessment   Diagnosis s/p ORIF for left distal radius and ulnar fracture     Precautions   Precautions None                  OT Treatments/Exercises (OP) - 07/13/17 1424      Exercises   Exercises Wrist;Hand     Weighted Stretch Over Towel Roll   Supination - Weighted Stretch 2 pounds;60 seconds  2 sets   Wrist Extension - Weighted Stretch 2 pounds;60 seconds  2 sets     Wrist Exercises   Forearm Supination PROM;5 reps   Forearm Pronation PROM;5 reps   Wrist Flexion PROM;5 reps    Wrist Extension PROM;5 reps   Wrist Radial Deviation PROM;5 reps   Wrist Ulnar Deviation PROM;5 reps   Other wrist exercises Left thumb CMC joint mobility; 5X P/ROM     Manual Therapy   Manual Therapy Muscle Energy Technique;Myofascial release   Manual therapy comments completed separately from therapeutic exercises   Myofascial Release Myofascial release to left dorsal hand, wrist, dorsal and volar forearms to decrease pain and fascial restrictions and increase joint range of motion.    Muscle Energy Technique Muscle energy technique to wrist extensors to increase joint range of motion and decrease pain and muscle spasms                  OT Short Term Goals - 07/04/17 1406      OT SHORT TERM GOAL #1   Title Pt will be educated on HEP for improved use of LUE during B/IADL completion.    Time 3   Period Weeks   Status On-going     OT SHORT TERM GOAL #2   Title Pt will improve left hand/wrist P/ROM by 10 degrees to improve ability to use LUE as assist during dressing tasks.    Time 3  Period Weeks   Status On-going           OT Long Term Goals - 07/04/17 1407      OT LONG TERM GOAL #1   Title Pt will improve A/ROM of left hand and wrist by 15 degrees to improve ability to use LUE as assist during bathing tasks.    Time 6   Period Weeks   Status On-going     OT LONG TERM GOAL #2   Title Pt will increase functional use of LUE by using left hand for ADL completion a minimum of 25% of the time.    Time 6   Period Weeks   Status On-going     OT LONG TERM GOAL #3   Title Pt will improve left grip strength by 5# and pinch strength by 2# to improve ability to grasp and maintain hold of lightweight items.    Time 6   Period Weeks   Status On-going     OT LONG TERM GOAL #4   Title Pt will improve left wrist strength to 3-/5 to improve ability to use LUE as assist during meal preparation tasks.    Time 6   Period Weeks   Status On-going                Plan - 07/13/17 1437    Clinical Impression Statement A: Continued to focus on manual therapy  to increase forearm, wrist, and hand movement and use. Patient showing gains with supination, pronation, and wrist flexion.    Plan P: Continue with manual therapy to wrist/forearm and hand, with focus on wrist mobility. Add stretches to HEP.      Patient will benefit from skilled therapeutic intervention in order to improve the following deficits and impairments:  Increased muscle spasms, Decreased scar mobility, Impaired flexibility, Decreased strength, Decreased activity tolerance, Decreased mobility, Pain, Increased edema, Impaired tone, Decreased range of motion, Decreased coordination, Increased fascial restricitons, Impaired UE functional use  Visit Diagnosis: Other symptoms and signs involving the musculoskeletal system  Stiffness of left wrist, not elsewhere classified  Stiffness of left hand, not elsewhere classified    Problem List Patient Active Problem List   Diagnosis Date Noted  . Left carotid bruit 03/15/2013  . Aortic valve stenosis, mild 03/15/2013  . Essential hypertension   . Hypercholesteremia   . Coronary artery disease 09/02/2009   Ailene Ravel, OTR/L,CBIS  [phone]  07/13/2017, 2:59 PM  [place] [address] [place], [place], [address] Phone: [phone]   Fax:  [phone]  Name: HEDWIG MCFALL MRN: [id number] Date of Birth: 06/21/1946

## 2017-07-13 NOTE — Therapy (Signed)
[place] [address] [place], [place], [address] Phone: [phone]   Fax:  [phone]  Physical Therapy Treatment  Patient Details  Name: Ann Patel MRN: [id number] Date of Birth: June 05, 1946 Referring Provider: Corene Cornea Heggerick, PA-C  Encounter Date: 07/13/2017    Past Medical History:  Diagnosis Date  . Coronary artery disease 09/2009   stress test EF 67%  . Hypercholesteremia   . Hypertension   . Myocardial infarction [facility])     Past Surgical History:  Procedure Laterality Date  . BREAST CYST EXCISION     fibro cyst removal  . CORONARY ANGIOPLASTY WITH STENT PLACEMENT Right 07/2005   receiving two consecutive drug eluting 2.5x75m Taxus stents in the proximal to mid right coronary artery  . TONSILLECTOMY      There were no vitals filed for this visit.      Subjective Assessment - 07/13/17 1626    Subjective Pt states she is doing well today and not having any pain.  Asking about getting a JAS.   Currently in Pain? No/denies                         [facility] 07[facility]17 1643      Knee/Hip Exercises: Stretches   Knee: Self-Stretch to increase Flexion Left;3 reps;10 seconds   Gastroc Stretch 2 reps;30 seconds   Gastroc Stretch Limitations on step     Knee/Hip Exercises: Supine   Quad Sets 10 reps   Short Arc Quad Sets 10 reps   Heel Slides 10 reps   Knee Extension Limitations 15   Knee Flexion Limitations 100     Manual Therapy   Manual Therapy Soft tissue mobilization;Myofascial release   Manual therapy comments completed separately from therapeutic exercises   Myofascial Release to perimeter of knee to decrease scar tissue and adhesions   Passive ROM to knee to increase ROM                  PT Short Term Goals - 07/11/17 1526      PT SHORT TERM GOAL #1   Title Pt will be independent with HEP and perform consistently to maximize return to PLOF.   Time 3   Period Weeks   Status Achieved     PT SHORT TERM GOAL #2   Title Pt will have improved L knee AROM at least 5-110 to maximize gait.    Baseline 10/9: 12-110   Time 3   Period Weeks   Status Partially Met     PT SHORT TERM GOAL #3   Title Pt will have improved 5xSTS to <10 sec with proper mechanics and weight shift over LLE to demo improved functional strength.   Time 3   Period Weeks   Status Achieved           PT Long Term Goals - 07/11/17 1526      PT LONG TERM GOAL #1   Title Pt will have improved L knee AROM to 0-120 to maximize gait and return to PLOF.   Baseline 10/9: 12-110   Time 6   Period Weeks   Status On-going     PT LONG TERM GOAL #2   Title Pt will have improved TUG to 10 sec or < with no AD to demo improved gait and community participation.   Baseline 10/9: 8.3 sec no AD, but with increased gait deviations   Time 6  Period Weeks   Status Partially Met     PT LONG TERM GOAL #3   Title Pt will have improved SLS on L to at least 15 sec with min to no unsteadiness to demo improved balance and maximize gait on uneven ground   Baseline 10/9: 16 sec LLE    Time 6   Period Weeks   Status Achieved     PT LONG TERM GOAL #4   Title Pt will have improved 3MWT to 656f or > with LRAD and with proper gait mechanics to demo improved overall function.    Baseline 10/9: 7319f no AD but with increased gait mechanics   Time 6   Period Weeks   Status Partially Met     PT LONG TERM GOAL #5   Title Pt will report being able to return to her regular walking program at least 3x/week to demo improved tolerance to activity, gait, balance, and strength.    Time 6   Period Weeks   Status Achieved               Plan - 07/13/17 1615    Clinical Impression Statement Continued with primary focus on improving Lt knee ROM.  Discussed sending order to MD for JAS to see if this would be beneficial in assisting with ROM.  Worked on ambulation with AD and with  good cadence and UE swing, mainly deficits due to unable to extend knee fully.  Continue manual to decrease adhesions and scar tissue lateral and anterior knee.   Order faxed to MD for J[place]  Rehab Potential Good   PT Frequency 2x / week   PT Duration 6 weeks   PT Treatment/Interventions ADLs/Self Care Home Management;Cryotherapy;Electrical Stimulation;Moist Heat;Ultrasound;DME Instruction;Gait training;Stair training;Functional mobility training;Therapeutic activities;Therapeutic exercise;Balance training;Neuromuscular re-education;Patient/family education;Manual techniques;Scar mobilization;Passive range of motion;Dry needling;Energy conservation;Taping   PT Next Visit Plan Continue to improve functional strengthening and knee AROM.  Manual techniques to improve tissue mobility.  Follow up on signed order for JAS and send to J[place] measurements.   PT Home Exercise Plan eval: quad sets, heel slides; 07/06/17 seated knee flexion sustained; 10/9: standing calf stretch on step, knee drives on step, supine prolonged knee extension stretch with weight   Consulted and Agree with Plan of Care Patient      Patient will benefit from skilled therapeutic intervention in order to improve the following deficits and impairments:  Abnormal gait, Decreased activity tolerance, Decreased balance, Decreased endurance, Decreased mobility, Decreased range of motion, Decreased strength, Difficulty walking, Hypomobility, Increased fascial restricitons, Increased muscle spasms, Impaired flexibility, Impaired UE functional use, Improper body mechanics  Visit Diagnosis: Stiffness of right knee, not elsewhere classified  Muscle weakness (generalized)  Difficulty in walking, not elsewhere classified  Other symptoms and signs involving the musculoskeletal system     Problem List Patient Active Problem List   Diagnosis Date Noted  . Left carotid bruit 03/15/2013  . Aortic valve stenosis, mild 03/15/2013  .  Essential hypertension   . Hypercholesteremia   . Coronary artery disease 09/02/2009   AmTeena IraniPTA/CLT [phone] FrTeena Irani0/08/2017, 4:46 PM  [place]: [phone] Fax:  [phone]: Ann Patel: [id number] of Birth: 5/Jun 25, 1946

## 2017-07-14 ENCOUNTER — Encounter (HOSPITAL_COMMUNITY): Payer: Self-pay | Admitting: Occupational Therapy

## 2017-07-14 ENCOUNTER — Ambulatory Visit (HOSPITAL_COMMUNITY): Payer: Commercial Managed Care - HMO | Admitting: Occupational Therapy

## 2017-07-14 DIAGNOSIS — M25661 Stiffness of right knee, not elsewhere classified: Secondary | ICD-10-CM | POA: Diagnosis not present

## 2017-07-14 DIAGNOSIS — M25632 Stiffness of left wrist, not elsewhere classified: Secondary | ICD-10-CM

## 2017-07-14 DIAGNOSIS — R262 Difficulty in walking, not elsewhere classified: Secondary | ICD-10-CM | POA: Diagnosis not present

## 2017-07-14 DIAGNOSIS — R29898 Other symptoms and signs involving the musculoskeletal system: Secondary | ICD-10-CM | POA: Diagnosis not present

## 2017-07-14 DIAGNOSIS — M6281 Muscle weakness (generalized): Secondary | ICD-10-CM | POA: Diagnosis not present

## 2017-07-14 DIAGNOSIS — M25642 Stiffness of left hand, not elsewhere classified: Secondary | ICD-10-CM

## 2017-07-14 NOTE — Therapy (Signed)
[place], [place], [address] Phone: [phone]   Fax:  [phone]  Occupational Therapy Treatment  Patient Details  Name: Ann Patel MRN: [id number] Date of Birth: [birth year] Referring Provider: Corene Cornea Heggerick, Pa-C  Encounter Date: 07/14/2017      OT End of Session - 07/14/17 1601    Visit Number 6   Number of Visits 18   Date for OT Re-Evaluation 08/04/17   Authorization Type Car Insurance being billed; pt is bringing information   OT Start Time 1520   OT Stop Time 1600   OT Time Calculation (min) 40 min   Activity Tolerance Patient tolerated treatment well   Behavior During Therapy [facility] for tasks assessed/performed      Past Medical History:  Diagnosis Date  . Coronary artery disease 09/2009   stress test EF 67%  . Hypercholesteremia   . Hypertension   . Myocardial infarction [facility])     Past Surgical History:  Procedure Laterality Date  . BREAST CYST EXCISION     fibro cyst removal  . CORONARY ANGIOPLASTY WITH STENT PLACEMENT Right 07/2005   receiving two consecutive drug eluting 2.5x63mm Taxus stents in the proximal to mid right coronary artery  . TONSILLECTOMY      There were no vitals filed for this visit.      Subjective Assessment - 07/14/17 1520    Subjective  S: I wish it would just get right.    Currently in Pain? No/denies            [facility] OT Assessment - 07/14/17 1520      Assessment   Diagnosis s/p ORIF for left distal radius and ulnar fracture     Precautions   Precautions None                  OT Treatments/Exercises (OP) - 07/14/17 1549      Exercises   Exercises Wrist;Hand     Weighted Stretch Over Towel Roll   Supination - Weighted Stretch 2 pounds;60 seconds  2 sets   Wrist Extension - Weighted Stretch 2 pounds;60 seconds  2 sets     Wrist Exercises   Forearm Supination PROM;5 reps;AROM;15 reps   Forearm Pronation PROM;5  reps;AROM;15 reps   Wrist Flexion PROM;5 reps   Wrist Extension PROM;5 reps   Wrist Radial Deviation PROM;5 reps   Wrist Ulnar Deviation PROM;5 reps   Other wrist exercises Left thumb CMC joint mobility; 5X P/ROM     Manual Therapy   Manual Therapy Myofascial release;Muscle Energy Technique   Manual therapy comments completed separately from therapeutic exercises   Myofascial Release Myofascial release to left dorsal hand, wrist, dorsal and volar forearms to decrease pain and fascial restrictions and increase joint range of motion.    Muscle Energy Technique Muscle energy technique to wrist extensors to increase joint range of motion and decrease pain and muscle spasms                  OT Short Term Goals - 07/04/17 1406      OT SHORT TERM GOAL #1   Title Pt will be educated on HEP for improved use of LUE during B/IADL completion.    Time 3   Period Weeks   Status On-going     OT SHORT TERM GOAL #2   Title Pt will improve left hand/wrist P/ROM by 10 degrees to improve ability to use LUE as assist during dressing  tasks.    Time 3   Period Weeks   Status On-going           OT Long Term Goals - 07/04/17 1407      OT LONG TERM GOAL #1   Title Pt will improve A/ROM of left hand and wrist by 15 degrees to improve ability to use LUE as assist during bathing tasks.    Time 6   Period Weeks   Status On-going     OT LONG TERM GOAL #2   Title Pt will increase functional use of LUE by using left hand for ADL completion a minimum of 25% of the time.    Time 6   Period Weeks   Status On-going     OT LONG TERM GOAL #3   Title Pt will improve left grip strength by 5# and pinch strength by 2# to improve ability to grasp and maintain hold of lightweight items.    Time 6   Period Weeks   Status On-going     OT LONG TERM GOAL #4   Title Pt will improve left wrist strength to 3-/5 to improve ability to use LUE as assist during meal preparation tasks.    Time 6   Period  Weeks   Status On-going               Plan - 07/14/17 1601    Clinical Impression Statement A: Session focusing on manual therapy working to increase mobility and functional use of forearm/wrist/hand. Pt demonstrates improvement with active supination this session. Reviewed HEP and encouraged continued completion.    Plan P: Add wrist stretches to HEP, continue with manual therapy. Utilize parrafin if appropriate.   OT Home Exercise Plan 10/2: wrist/forearm A/ROM   Consulted and Agree with Plan of Care Patient      Patient will benefit from skilled therapeutic intervention in order to improve the following deficits and impairments:  Increased muscle spasms, Decreased scar mobility, Impaired flexibility, Decreased strength, Decreased activity tolerance, Decreased mobility, Pain, Increased edema, Impaired tone, Decreased range of motion, Decreased coordination, Increased fascial restricitons, Impaired UE functional use  Visit Diagnosis: Other symptoms and signs involving the musculoskeletal system  Stiffness of left wrist, not elsewhere classified  Stiffness of left hand, not elsewhere classified    Problem List Patient Active Problem List   Diagnosis Date Noted  . Left carotid bruit 03/15/2013  . Aortic valve stenosis, mild 03/15/2013  . Essential hypertension   . Hypercholesteremia   . Coronary artery disease 09/02/2009   Guadelupe Sabin, OTR/L  [phone] 07/14/2017, 4:03 PM  [place] [address] [place], [place], [address] Phone: [phone]   Fax:  [phone]  Name: Ann Patel MRN: [id number] Date of Birth: [birth year]

## 2017-07-17 ENCOUNTER — Ambulatory Visit (HOSPITAL_COMMUNITY): Payer: Commercial Managed Care - HMO | Admitting: Specialist

## 2017-07-17 DIAGNOSIS — M6281 Muscle weakness (generalized): Secondary | ICD-10-CM | POA: Diagnosis not present

## 2017-07-17 DIAGNOSIS — R29898 Other symptoms and signs involving the musculoskeletal system: Secondary | ICD-10-CM

## 2017-07-17 DIAGNOSIS — M25642 Stiffness of left hand, not elsewhere classified: Secondary | ICD-10-CM

## 2017-07-17 DIAGNOSIS — R262 Difficulty in walking, not elsewhere classified: Secondary | ICD-10-CM | POA: Diagnosis not present

## 2017-07-17 DIAGNOSIS — M25632 Stiffness of left wrist, not elsewhere classified: Secondary | ICD-10-CM

## 2017-07-17 DIAGNOSIS — M25661 Stiffness of right knee, not elsewhere classified: Secondary | ICD-10-CM | POA: Diagnosis not present

## 2017-07-17 NOTE — Therapy (Signed)
[place], [place], [address] Phone: [phone]   Fax:  [phone]  Occupational Therapy Treatment  Patient Details  Name: Ann Patel MRN: [id number] Date of Birth: June 20, 1946 Referring Provider: Corene Cornea Heggerick, Pa-C  Encounter Date: 07/17/2017      OT End of Session - 07/17/17 1653    Visit Number 7   Number of Visits 18   Date for OT Re-Evaluation 08/04/17   Authorization Type Car Insurance being billed; pt is bringing information   OT Start Time 1534  patient arrived 15 minutes late for appt   OT Stop Time 1600   OT Time Calculation (min) 26 min   Activity Tolerance Patient tolerated treatment well   Behavior During Therapy [facility] for tasks assessed/performed      Past Medical History:  Diagnosis Date  . Coronary artery disease 09/2009   stress test EF 67%  . Hypercholesteremia   . Hypertension   . Myocardial infarction [facility])     Past Surgical History:  Procedure Laterality Date  . BREAST CYST EXCISION     fibro cyst removal  . CORONARY ANGIOPLASTY WITH STENT PLACEMENT Right 07/2005   receiving two consecutive drug eluting 2.5x60mm Taxus stents in the proximal to mid right coronary artery  . TONSILLECTOMY      There were no vitals filed for this visit.      Subjective Assessment - 07/17/17 1653    Subjective  S:  Its just so stiff.            [facility] OT Assessment - 07/17/17 0001      Assessment   Diagnosis s/p ORIF for left distal radius and ulnar fracture     Precautions   Precautions None                  OT Treatments/Exercises (OP) - 07/17/17 0001      Exercises   Exercises Wrist;Hand     Wrist Exercises   Forearm Supination PROM;5 reps;AROM;15 reps   Forearm Pronation PROM;5 reps;AROM;15 reps   Wrist Flexion PROM;5 reps;AROM;10 reps   Wrist Extension PROM;5 reps;AROM;10 reps   Wrist Radial Deviation PROM;5 reps;AROM;10 reps   Wrist Ulnar Deviation  PROM;5 reps;AROM;10 reps     Hand Exercises   Joint Blocking Exercises p/rom all digits and joints   Opposition PROM;AROM;5 reps   Other Hand Exercises gross fist and release 10 times a/rom    Other Hand Exercises flex and hold for 5 seconds X 5 reps      Manual Therapy   Manual Therapy Myofascial release   Manual therapy comments completed separately from therapeutic exercises   Myofascial Release Myofascial release to left dorsal hand, wrist, dorsal and volar forearms to decrease pain and fascial restrictions and increase joint range of motion.                   OT Short Term Goals - 07/04/17 1406      OT SHORT TERM GOAL #1   Title Pt will be educated on HEP for improved use of LUE during B/IADL completion.    Time 3   Period Weeks   Status On-going     OT SHORT TERM GOAL #2   Title Pt will improve left hand/wrist P/ROM by 10 degrees to improve ability to use LUE as assist during dressing tasks.    Time 3   Period Weeks   Status On-going  OT Long Term Goals - 07/04/17 1407      OT LONG TERM GOAL #1   Title Pt will improve A/ROM of left hand and wrist by 15 degrees to improve ability to use LUE as assist during bathing tasks.    Time 6   Period Weeks   Status On-going     OT LONG TERM GOAL #2   Title Pt will increase functional use of LUE by using left hand for ADL completion a minimum of 25% of the time.    Time 6   Period Weeks   Status On-going     OT LONG TERM GOAL #3   Title Pt will improve left grip strength by 5# and pinch strength by 2# to improve ability to grasp and maintain hold of lightweight items.    Time 6   Period Weeks   Status On-going     OT LONG TERM GOAL #4   Title Pt will improve left wrist strength to 3-/5 to improve ability to use LUE as assist during meal preparation tasks.    Time 6   Period Weeks   Status On-going               Plan - 07/17/17 1654    Clinical Impression Statement A:  Patient session  focused on manual therapy to improve available mobility in her left forearm, wrist, hand and improving available functional movement.     Plan P: Add wrist stretch to HEP, parrafin prior to manual therapy, functional reaching activity.      Patient will benefit from skilled therapeutic intervention in order to improve the following deficits and impairments:  Increased muscle spasms, Decreased scar mobility, Impaired flexibility, Decreased strength, Decreased activity tolerance, Decreased mobility, Pain, Increased edema, Impaired tone, Decreased range of motion, Decreased coordination, Increased fascial restricitons, Impaired UE functional use  Visit Diagnosis: Other symptoms and signs involving the musculoskeletal system  Stiffness of left wrist, not elsewhere classified  Stiffness of left hand, not elsewhere classified    Problem List Patient Active Problem List   Diagnosis Date Noted  . Left carotid bruit 03/15/2013  . Aortic valve stenosis, mild 03/15/2013  . Essential hypertension   . Hypercholesteremia   . Coronary artery disease 09/02/2009    Vangie Bicker, [place], OTR/L [phone]  07/17/2017, 4:59 PM  [place] [address] [place], [place], [address] Phone: [phone]   Fax:  [phone]  Name: Ann Patel MRN: [id number] Date of Birth: 11-Mar-1946

## 2017-07-18 ENCOUNTER — Telehealth (HOSPITAL_COMMUNITY): Payer: Self-pay

## 2017-07-18 ENCOUNTER — Encounter (HOSPITAL_COMMUNITY): Payer: Self-pay

## 2017-07-18 ENCOUNTER — Ambulatory Visit (HOSPITAL_COMMUNITY): Payer: Commercial Managed Care - HMO

## 2017-07-18 ENCOUNTER — Other Ambulatory Visit: Payer: Self-pay | Admitting: Cardiovascular Disease

## 2017-07-18 DIAGNOSIS — M25642 Stiffness of left hand, not elsewhere classified: Secondary | ICD-10-CM

## 2017-07-18 DIAGNOSIS — M25632 Stiffness of left wrist, not elsewhere classified: Secondary | ICD-10-CM | POA: Diagnosis not present

## 2017-07-18 DIAGNOSIS — M25661 Stiffness of right knee, not elsewhere classified: Secondary | ICD-10-CM

## 2017-07-18 DIAGNOSIS — R262 Difficulty in walking, not elsewhere classified: Secondary | ICD-10-CM | POA: Diagnosis not present

## 2017-07-18 DIAGNOSIS — M6281 Muscle weakness (generalized): Secondary | ICD-10-CM | POA: Diagnosis not present

## 2017-07-18 DIAGNOSIS — R29898 Other symptoms and signs involving the musculoskeletal system: Secondary | ICD-10-CM | POA: Diagnosis not present

## 2017-07-18 NOTE — Therapy (Signed)
[place], [place], [address] Phone: [phone]   Fax:  [phone]  Occupational Therapy Treatment  Patient Details  Name: Ann Patel MRN: [id number] Date of Birth: 1946/06/06 Referring Provider: Corene Cornea Heggerick, Pa-C  Encounter Date: 07/18/2017      OT End of Session - 07/18/17 1511    Visit Number 8   Number of Visits 18   Date for OT Re-Evaluation 08/04/17   Authorization Type Car Insurance being billed; pt is bringing information   OT Start Time 1350   OT Stop Time 1430   OT Time Calculation (min) 40 min   Activity Tolerance Patient tolerated treatment well   Behavior During Therapy [facility] for tasks assessed/performed      Past Medical History:  Diagnosis Date  . Coronary artery disease 09/2009   stress test EF 67%  . Hypercholesteremia   . Hypertension   . Myocardial infarction [facility])     Past Surgical History:  Procedure Laterality Date  . BREAST CYST EXCISION     fibro cyst removal  . CORONARY ANGIOPLASTY WITH STENT PLACEMENT Right 07/2005   receiving two consecutive drug eluting 2.5x57mm Taxus stents in the proximal to mid right coronary artery  . TONSILLECTOMY      There were no vitals filed for this visit.      Subjective Assessment - 07/18/17 1427    Subjective  S: No pain today.   Currently in Pain? No/denies            [facility] OT Assessment - 07/18/17 1427      Assessment   Diagnosis s/p ORIF for left distal radius and ulnar fracture     Precautions   Precautions None                  OT Treatments/Exercises (OP) - 07/18/17 1427      Exercises   Exercises Wrist;Hand     Wrist Exercises   Forearm Supination PROM;10 reps   Forearm Pronation PROM;10 reps   Wrist Flexion PROM;10 reps   Wrist Extension PROM;10 reps   Wrist Radial Deviation PROM;10 reps   Wrist Ulnar Deviation PROM;10 reps   Other wrist exercises Wrist stretch using railing on stairs  to encourage wrist extension; 10 second hold 2 sets.     Functional Reaching Activities   High [place] Utilized yellow clothespins to complete a functional reaching task while seated. patient used left hand to place all yellow clothespins at highest level using vertical pole.      Modalities   Modalities Moist Heat;Paraffin     Moist Heat Therapy   Number Minutes Moist Heat 10 Minutes   Moist Heat Location Wrist;Hand     LUE Paraffin   Number Minutes Paraffin 10 Minutes   LUE Paraffin Location Hand;Wrist   Type Other   Comments for ROM     Manual Therapy   Manual Therapy Myofascial release   Manual therapy comments completed separately from therapeutic exercises   Myofascial Release Myofascial release to left dorsal hand, wrist, dorsal and volar forearms to decrease pain and fascial restrictions and increase joint range of motion.                 OT Education - 07/18/17 1510    Education provided Yes   Education Details wrist stretches (flexion and extension)   Person(s) Educated Patient   Methods Explanation;Demonstration;Verbal cues;Handout   Comprehension Returned demonstration;Verbalized understanding  OT Short Term Goals - 07/04/17 1406      OT SHORT TERM GOAL #1   Title Pt will be educated on HEP for improved use of LUE during B/IADL completion.    Time 3   Period Weeks   Status On-going     OT SHORT TERM GOAL #2   Title Pt will improve left hand/wrist P/ROM by 10 degrees to improve ability to use LUE as assist during dressing tasks.    Time 3   Period Weeks   Status On-going           OT Long Term Goals - 07/04/17 1407      OT LONG TERM GOAL #1   Title Pt will improve A/ROM of left hand and wrist by 15 degrees to improve ability to use LUE as assist during bathing tasks.    Time 6   Period Weeks   Status On-going     OT LONG TERM GOAL #2   Title Pt will increase functional use of LUE by using left hand for ADL completion a minimum  of 25% of the time.    Time 6   Period Weeks   Status On-going     OT LONG TERM GOAL #3   Title Pt will improve left grip strength by 5# and pinch strength by 2# to improve ability to grasp and maintain hold of lightweight items.    Time 6   Period Weeks   Status On-going     OT LONG TERM GOAL #4   Title Pt will improve left wrist strength to 3-/5 to improve ability to use LUE as assist during meal preparation tasks.    Time 6   Period Weeks   Status On-going               Plan - 07/18/17 1511    Clinical Impression Statement A: Parrafin used at beginning of session to increase functional mobility prior to manual therapy. Updated HEP to include wrist stretches. patient had difficulty with manipulating clothespins and was limited with shoulder ROM for functional reaching tasks.    Plan P: Continue to work on increase wrist and forearm ROM. Continue with paraffin one more time to see if it helps increase ROM.       Patient will benefit from skilled therapeutic intervention in order to improve the following deficits and impairments:  Increased muscle spasms, Decreased scar mobility, Impaired flexibility, Decreased strength, Decreased activity tolerance, Decreased mobility, Pain, Increased edema, Impaired tone, Decreased range of motion, Decreased coordination, Increased fascial restricitons, Impaired UE functional use  Visit Diagnosis: Other symptoms and signs involving the musculoskeletal system  Stiffness of left wrist, not elsewhere classified  Stiffness of left hand, not elsewhere classified  Stiffness of right knee, not elsewhere classified    Problem List Patient Active Problem List   Diagnosis Date Noted  . Left carotid bruit 03/15/2013  . Aortic valve stenosis, mild 03/15/2013  . Essential hypertension   . Hypercholesteremia   . Coronary artery disease 09/02/2009   Ann Patel, OTR/L,CBIS  [phone]  07/18/2017, 3:20 PM  [place] [address] [place], [place], [address] Phone: [phone]   Fax:  [phone]  Name: Ann Patel MRN: [id number] Date of Birth: 12-Jul-1946

## 2017-07-18 NOTE — Therapy (Signed)
[place], [place], [address] Phone: [phone]   Fax:  [phone]  Physical Therapy Treatment  Patient Details  Name: Ann Patel MRN: [id number] Date of Birth: 26-Dec-1945 Referring Provider: Corene Cornea Heggerick, PA-C  Encounter Date: 07/18/2017      PT End of Session - 07/18/17 1525    Visit Number 7   Number of Visits 13   Date for PT Re-Evaluation 08/02/17   Authorization Type [employer] (g-codes done on 2022/12/22 visit)   Authorization Time Period 06/21/17 to 08/02/17   Authorization - Visit Number 7   Authorization - Number of Visits 13   PT Start Time 7858   PT Stop Time 1515   PT Time Calculation (min) 39 min   Equipment Utilized During Treatment Gait belt   Activity Tolerance Patient tolerated treatment well;No increased pain   Behavior During Therapy WFL for tasks assessed/performed      Past Medical History:  Diagnosis Date  . Coronary artery disease 09/2009   stress test EF 67%  . Hypercholesteremia   . Hypertension   . Myocardial infarction [facility]     Past Surgical History:  Procedure Laterality Date  . BREAST CYST EXCISION     fibro cyst removal  . CORONARY ANGIOPLASTY WITH STENT PLACEMENT Right 07/2005   receiving two consecutive drug eluting 2.5x16m Taxus stents in the proximal to mid right coronary artery  . TONSILLECTOMY      There were no vitals filed for this visit.      Subjective Assessment - 07/18/17 1435    Subjective Pt states she is doing well today, notices some improvements. Denies pain   Currently in Pain? No/denies                         [facility] 07[facility]17 1457      Knee/Hip Exercises: Standing   Terminal Knee Extension Limitations 10x 5" with GTB     Knee/Hip Exercises: Supine   Quad Sets 15 reps;Left   Short Arc Quad Sets 15 reps;Left   Terminal Knee Extension 10 reps   Theraband Level (Terminal Knee  Extension) Level 3 (Green)   Knee Extension Limitations 15   Knee Flexion Limitations 100  105 seated overpressure                  PT Short Term Goals - 07/11/17 1526      PT SHORT TERM GOAL #1   Title Pt will be independent with HEP and perform consistently to maximize return to PLOF.   Time 3   Period Weeks   Status Achieved     PT SHORT TERM GOAL #2   Title Pt will have improved L knee AROM at least 5-110 to maximize gait.    Baseline 10/9: 12-110   Time 3   Period Weeks   Status Partially Met     PT SHORT TERM GOAL #3   Title Pt will have improved 5xSTS to <10 sec with proper mechanics and weight shift over LLE to demo improved functional strength.   Time 3   Period Weeks   Status Achieved           PT Long Term Goals - 07/11/17 1526      PT LONG TERM GOAL #1   Title Pt will have improved L knee AROM to 0-120 to maximize gait and return to PLOF.  Baseline 10/9: 12-110   Time 6   Period Weeks   Status On-going     PT LONG TERM GOAL #2   Title Pt will have improved TUG to 10 sec or < with no AD to demo improved gait and community participation.   Baseline 10/9: 8.3 sec no AD, but with increased gait deviations   Time 6   Period Weeks   Status Partially Met     PT LONG TERM GOAL #3   Title Pt will have improved SLS on L to at least 15 sec with min to no unsteadiness to demo improved balance and maximize gait on uneven ground   Baseline 10/9: 16 sec LLE    Time 6   Period Weeks   Status Achieved     PT LONG TERM GOAL #4   Title Pt will have improved to 61ft or > with LRAD and with proper gait mechanics to demo improved overall function.    Baseline 10/9: 761ft, no AD but with increased gait mechanics   Time 6   Period Weeks   Status Partially Met     PT LONG TERM GOAL #5   Title Pt will report being able to return to her regular walking program at least 3x/week to demo improved tolerance to activity, gait, balance, and strength.     Time 6   Period Weeks   Status Achieved               Plan - 07/18/17 1526    Clinical Impression Statement Continued focus on Lt knee ROM and took measurements for JAS brace to improve mobility. She is able to tolerate ROM with overpressure well sitting on the edge of the bed. Continues to present with stiffness with increased ache with limited range. Order faxed with measurements for JAS brace.    Rehab Potential Good   PT Frequency 2x / week   PT Duration 6 weeks   PT Treatment/Interventions ADLs/Self Care Home Management;Cryotherapy;Electrical Stimulation;Moist Heat;Ultrasound;DME Instruction;Gait training;Stair training;Functional mobility training;Therapeutic activities;Therapeutic exercise;Balance training;Neuromuscular re-education;Patient/family education;Manual techniques;Scar mobilization;Passive range of motion;Dry needling;Energy conservation;Taping   PT Next Visit Plan Continue to improve functional strengthening and knee AROM.  Manual techniques to improve tissue mobility.  Follow up on JAS brace - faxed 07/18/2017.    PT Home Exercise Plan eval: quad sets, heel slides; 07/06/17 seated knee flexion sustained; 10/9: standing calf stretch on step, knee drives on step, supine prolonged knee extension stretch with weight   Consulted and Agree with Plan of Care Patient      Patient will benefit from skilled therapeutic intervention in order to improve the following deficits and impairments:  Abnormal gait, Decreased activity tolerance, Decreased balance, Decreased endurance, Decreased mobility, Decreased range of motion, Decreased strength, Difficulty walking, Hypomobility, Increased fascial restricitons, Increased muscle spasms, Impaired flexibility, Impaired UE functional use, Improper body mechanics  Visit Diagnosis: Stiffness of right knee, not elsewhere classified  Muscle weakness (generalized)  Difficulty in walking, not elsewhere classified     Problem  List Patient Active Problem List   Diagnosis Date Noted  . Left carotid bruit 03/15/2013  . Aortic valve stenosis, mild 03/15/2013  . Essential hypertension   . Hypercholesteremia   . Coronary artery disease 09/02/2009   Candise Che PT, DPT 3:33 PM, 07/18/17 [phone]  [facility] [address] [place], [place], [address] Phone: [phone]   Fax:  [phone]  Name: ANNELISE MCCOY MRN: [id number] Date of Birth: May 28, 1946

## 2017-07-18 NOTE — Telephone Encounter (Signed)
Pt states she requested that her private insurance be billed with [facility] on 07/17/17, per her laywer. NF

## 2017-07-18 NOTE — Patient Instructions (Signed)
Complete all exercises 2 time a day. Hold for 10 seconds and complete 2 sets.  WRIST EXTENSION STRETCH - TABLE  Place boths hand on a table as shown and gently lean forward until a stretch is felt.         WRIST EXTENSOR STRETCH  Use your unaffected hand to bend the affected wrist down as shown.   Keep the elbow straight on the affected side the entire time.      WRIST FLEXOR STRETCH  Use your unaffected hand to bend the affected wrist up as shown.   Keep the elbow straight on the affected side the entire time.        Wrist Flexor Stretch  Gently pull wrist into extension with elbow extended, while keeping shoulder down.

## 2017-07-20 ENCOUNTER — Ambulatory Visit (HOSPITAL_COMMUNITY): Payer: Commercial Managed Care - HMO

## 2017-07-20 ENCOUNTER — Encounter (HOSPITAL_COMMUNITY): Payer: Self-pay

## 2017-07-20 DIAGNOSIS — R29898 Other symptoms and signs involving the musculoskeletal system: Secondary | ICD-10-CM | POA: Diagnosis not present

## 2017-07-20 DIAGNOSIS — M25642 Stiffness of left hand, not elsewhere classified: Secondary | ICD-10-CM

## 2017-07-20 DIAGNOSIS — M25632 Stiffness of left wrist, not elsewhere classified: Secondary | ICD-10-CM

## 2017-07-20 DIAGNOSIS — M6281 Muscle weakness (generalized): Secondary | ICD-10-CM | POA: Diagnosis not present

## 2017-07-20 DIAGNOSIS — M25661 Stiffness of right knee, not elsewhere classified: Secondary | ICD-10-CM

## 2017-07-20 DIAGNOSIS — R262 Difficulty in walking, not elsewhere classified: Secondary | ICD-10-CM | POA: Diagnosis not present

## 2017-07-20 NOTE — Therapy (Signed)
[place], [place], [address] Phone: [phone]   Fax:  [phone]  Occupational Therapy Treatment  Patient Details  Name: Ann Patel MRN: [id number] Date of Birth: 02/21/1946 Referring Provider: Corene Cornea Heggerick, Pa-C  Encounter Date: 07/20/2017      OT End of Session - 07/20/17 1715    Visit Number 9   Number of Visits 18   Date for OT Re-Evaluation 08/04/17   Authorization Type [employer] Insurance being billed; pt is bringing information   OT Start Time 1433   OT Stop Time 1517   OT Time Calculation (min) 44 min   Activity Tolerance Patient tolerated treatment well   Behavior During Therapy [facility] for tasks assessed/performed      Past Medical History:  Diagnosis Date  . Coronary artery disease 09/2009   stress test EF 67%  . Hypercholesteremia   . Hypertension   . Myocardial infarction [facility])     Past Surgical History:  Procedure Laterality Date  . BREAST CYST EXCISION     fibro cyst removal  . CORONARY ANGIOPLASTY WITH STENT PLACEMENT Right 07/2005   receiving two consecutive drug eluting 2.5x53mm Taxus stents in the proximal to mid right coronary artery  . TONSILLECTOMY      There were no vitals filed for this visit.      Subjective Assessment - 07/20/17 1446    Subjective  S: Nothing new to report.   Currently in Pain? No/denies            [facility] OT Assessment - 07/20/17 1447      Assessment   Diagnosis s/p ORIF for left distal radius and ulnar fracture     Precautions   Precautions None                  OT Treatments/Exercises (OP) - 07/20/17 1447      Exercises   Exercises Wrist;Hand     Weighted Stretch Over Towel Roll   Supination - Weighted Stretch 2 pounds;60 seconds  2 sets   Wrist Extension - Weighted Stretch 2 pounds;60 seconds  2 sets     Wrist Exercises   Forearm Supination PROM;10 reps   Forearm Pronation PROM;10 reps   Wrist Flexion PROM;10  reps   Wrist Extension PROM;10 reps   Wrist Radial Deviation PROM;10 reps   Wrist Ulnar Deviation PROM;10 reps   Other wrist exercises left thumb CMC join moility; 10X P/ROM     Modalities   Modalities Moist Heat;Paraffin     Moist Heat Therapy   Number Minutes Moist Heat 10 Minutes   Moist Heat Location Wrist;Hand     LUE Paraffin   Number Minutes Paraffin 10 Minutes   LUE Paraffin Location Hand;Wrist   Type Other   Comments for ROM     Manual Therapy   Manual Therapy Myofascial release   Manual therapy comments completed separately from therapeutic exercises   Myofascial Release Myofascial release to left dorsal hand, wrist, dorsal and volar forearms to decrease pain and fascial restrictions and increase joint range of motion.                   OT Short Term Goals - 07/04/17 1406      OT SHORT TERM GOAL #1   Title Pt will be educated on HEP for improved use of LUE during B/IADL completion.    Time 3   Period Weeks   Status On-going  OT SHORT TERM GOAL #2   Title Pt will improve left hand/wrist P/ROM by 10 degrees to improve ability to use LUE as assist during dressing tasks.    Time 3   Period Weeks   Status On-going           OT Long Term Goals - 07/04/17 1407      OT LONG TERM GOAL #1   Title Pt will improve A/ROM of left hand and wrist by 15 degrees to improve ability to use LUE as assist during bathing tasks.    Time 6   Period Weeks   Status On-going     OT LONG TERM GOAL #2   Title Pt will increase functional use of LUE by using left hand for ADL completion a minimum of 25% of the time.    Time 6   Period Weeks   Status On-going     OT LONG TERM GOAL #3   Title Pt will improve left grip strength by 5# and pinch strength by 2# to improve ability to grasp and maintain hold of lightweight items.    Time 6   Period Weeks   Status On-going     OT LONG TERM GOAL #4   Title Pt will improve left wrist strength to 3-/5 to improve ability  to use LUE as assist during meal preparation tasks.    Time 6   Period Weeks   Status On-going               Plan - 07/20/17 1716    Clinical Impression Statement A: Parrafin used at beginning of session although no positive outcome noted versus prior to use. Continued to work on forearm, wrist, and thumb CMC joint mobility.    Plan P: Continue to work on increasing wrist and forwarm ROM. Increase weighted stretch to 3#      Patient will benefit from skilled therapeutic intervention in order to improve the following deficits and impairments:  Increased muscle spasms, Decreased scar mobility, Impaired flexibility, Decreased strength, Decreased activity tolerance, Decreased mobility, Pain, Increased edema, Impaired tone, Decreased range of motion, Decreased coordination, Increased fascial restricitons, Impaired UE functional use  Visit Diagnosis: Other symptoms and signs involving the musculoskeletal system  Stiffness of left wrist, not elsewhere classified  Stiffness of left hand, not elsewhere classified    Problem List Patient Active Problem List   Diagnosis Date Noted  . Left carotid bruit 03/15/2013  . Aortic valve stenosis, mild 03/15/2013  . Essential hypertension   . Hypercholesteremia   . Coronary artery disease 09/02/2009    Ailene Ravel, OTR/L,CBIS  [phone]   07/20/2017, 5:20 PM  [place] [address] [place], [place], [address] Phone: [phone]   Fax:  [phone]  Name: Ann Patel MRN: [id number] Date of Birth: 1945/12/09

## 2017-07-20 NOTE — Therapy (Signed)
[place], [place], [address] Phone: [phone]   Fax:  [phone]  Physical Therapy Treatment  Patient Details  Name: Ann Patel MRN: [id number] Date of Birth: Nov 11, 1945 Referring Provider: Corene Cornea Heggerick, PA-C  Encounter Date: 07/20/2017      PT End of Session - 07/20/17 1518    Visit Number 8   Number of Visits 13   Date for PT Re-Evaluation 08/02/17   Authorization Type [employer] H[employer] (g-codes done on 10-16-22 visit)   Authorization Time Period 06/21/17 to 08/02/17   Authorization - Visit Number 8   Authorization - Number of Visits 13   PT Start Time 1443   PT Stop Time 1605   PT Time Calculation (min) 48 min   Equipment Utilized During Treatment Gait belt   Activity Tolerance Patient tolerated treatment well;No increased pain   Behavior During Therapy WFL for tasks assessed/performed      Past Medical History:  Diagnosis Date  . Coronary artery disease 09/2009   stress test EF 67%  . Hypercholesteremia   . Hypertension   . Myocardial infarction [facility]     Past Surgical History:  Procedure Laterality Date  . BREAST CYST EXCISION     fibro cyst removal  . CORONARY ANGIOPLASTY WITH STENT PLACEMENT Right 07/2005   receiving two consecutive drug eluting 2.5x104m Taxus stents in the proximal to mid right coronary artery  . TONSILLECTOMY      There were no vitals filed for this visit.      Subjective Assessment - 07/20/17 1518    Subjective Pt states that she is doing well today. She said that when she sits for a while her knee gets stiff.   Currently in Pain? No/denies              [facility]/Exercise - 07/20/17 1522      Knee/Hip Exercises: Stretches   Gastroc Stretch Both;3 reps;30 seconds   Gastroc Stretch Limitations slant board     Knee/Hip Exercises: Machines for SCounselling psychologistPROM: 90% ext and 90% flexion (started at 75% but was  able to quickly increase)     Knee/Hip Exercises: Standing   Heel Raises Both;15 reps   Heel Raises Limitations heel and toe   Terminal Knee Extension Limitations 15x10" with GTB   Rocker Board 2 minutes  R/L   SLS 5x10" on LLE only     Knee/Hip Exercises: Seated   Other Seated Knee/Hip Exercises contract relax for flexion x10" holds 3RT and for extension x10" holds 2RT     Knee/Hip Exercises: Supine   Quad Sets Left;10 reps   Quad Sets Limitations manual OP   Knee Extension Limitations 10   Knee Flexion Limitations 102  107 PROM     Manual Therapy   Manual Therapy Joint mobilization   Manual therapy comments completed separately from therapeutic exercises   Joint Mobilization AP, PA Knee joint mobs and sup/inf patellar mobs for ROM              PT Education - 07/20/17 1518    Education provided Yes   Person(s) Educated Patient   Methods Explanation;Demonstration   Comprehension Verbalized understanding;Returned demonstration          PT Short Term Goals - 07/11/17 1526      PT SHORT TERM GOAL #1   Title Pt will be independent with HEP and perform consistently to maximize return  to PLOF.   Time 3   Period Weeks   Status Achieved     PT SHORT TERM GOAL #2   Title Pt will have improved L knee AROM at least 5-110 to maximize gait.    Baseline 10/9: 12-110   Time 3   Period Weeks   Status Partially Met     PT SHORT TERM GOAL #3   Title Pt will have improved 5xSTS to <10 sec with proper mechanics and weight shift over LLE to demo improved functional strength.   Time 3   Period Weeks   Status Achieved           PT Long Term Goals - 07/11/17 1526      PT LONG TERM GOAL #1   Title Pt will have improved L knee AROM to 0-120 to maximize gait and return to PLOF.   Baseline 10/9: 12-110   Time 6   Period Weeks   Status On-going     PT LONG TERM GOAL #2   Title Pt will have improved TUG to 10 sec or < with no AD to demo improved gait and community  participation.   Baseline 10/9: 8.3 sec no AD, but with increased gait deviations   Time 6   Period Weeks   Status Partially Met     PT LONG TERM GOAL #3   Title Pt will have improved SLS on L to at least 15 sec with min to no unsteadiness to demo improved balance and maximize gait on uneven ground   Baseline 10/9: 16 sec LLE    Time 6   Period Weeks   Status Achieved     PT LONG TERM GOAL #4   Title Pt will have improved 3MWT to 634f or > with LRAD and with proper gait mechanics to demo improved overall function.    Baseline 10/9: 7380f no AD but with increased gait mechanics   Time 6   Period Weeks   Status Partially Met     PT LONG TERM GOAL #5   Title Pt will report being able to return to her regular walking program at least 3x/week to demo improved tolerance to activity, gait, balance, and strength.    Time 6   Period Weeks   Status Achieved               Plan - 07/20/17 1609    Clinical Impression Statement Session focused on improving ROM and functional strengthening of LLE. Introduced pt to contract relax for ext and flexion ROM, but had to perform in sitting due to pt being unable to lie prone because of her arm. Pt's AROM 10 to 102 and PROM flexion 107 at EOS. Instructed pt on how to perform self-patellar mobs. Continue POC as planned.    Rehab Potential Good   PT Frequency 2x / week   PT Duration 6 weeks   PT Treatment/Interventions ADLs/Self Care Home Management;Cryotherapy;Electrical Stimulation;Moist Heat;Ultrasound;DME Instruction;Gait training;Stair training;Functional mobility training;Therapeutic activities;Therapeutic exercise;Balance training;Neuromuscular re-education;Patient/family education;Manual techniques;Scar mobilization;Passive range of motion;Dry needling;Energy conservation;Taping   PT Next Visit Plan Continue to improve functional strengthening and knee AROM.  Manual techniques to improve tissue mobility, joint and patellar mobs.  Follow up  on JAS brace - faxed 07/18/2017.    PT Home Exercise Plan eval: quad sets, heel slides; 07/06/17 seated knee flexion sustained; 10/9: standing calf stretch on step, knee drives on step, supine prolonged knee extension stretch with weight; 10/18: heel and toe raises  Consulted and Agree with Plan of Care Patient      Patient will benefit from skilled therapeutic intervention in order to improve the following deficits and impairments:  Abnormal gait, Decreased activity tolerance, Decreased balance, Decreased endurance, Decreased mobility, Decreased range of motion, Decreased strength, Difficulty walking, Hypomobility, Increased fascial restricitons, Increased muscle spasms, Impaired flexibility, Impaired UE functional use, Improper body mechanics  Visit Diagnosis: Stiffness of right knee, not elsewhere classified  Muscle weakness (generalized)  Difficulty in walking, not elsewhere classified  Other symptoms and signs involving the musculoskeletal system     Problem List Patient Active Problem List   Diagnosis Date Noted  . Left carotid bruit 03/15/2013  . Aortic valve stenosis, mild 03/15/2013  . Essential hypertension   . Hypercholesteremia   . Coronary artery disease 09/02/2009       Geraldine Solar PT, DPT  [place] [address] [place], [place], [address] Phone: [phone]   Fax:  [phone]  Name: Ann Patel MRN: [id number] Date of Birth: 1946-09-19

## 2017-07-24 ENCOUNTER — Ambulatory Visit (HOSPITAL_COMMUNITY): Payer: Commercial Managed Care - HMO | Admitting: Physical Therapy

## 2017-07-24 ENCOUNTER — Ambulatory Visit (HOSPITAL_COMMUNITY): Payer: Commercial Managed Care - HMO | Admitting: Occupational Therapy

## 2017-07-24 ENCOUNTER — Encounter (HOSPITAL_COMMUNITY): Payer: Self-pay | Admitting: Physical Therapy

## 2017-07-24 ENCOUNTER — Encounter (HOSPITAL_COMMUNITY): Payer: Self-pay | Admitting: Occupational Therapy

## 2017-07-24 DIAGNOSIS — R262 Difficulty in walking, not elsewhere classified: Secondary | ICD-10-CM

## 2017-07-24 DIAGNOSIS — M25632 Stiffness of left wrist, not elsewhere classified: Secondary | ICD-10-CM

## 2017-07-24 DIAGNOSIS — R29898 Other symptoms and signs involving the musculoskeletal system: Secondary | ICD-10-CM

## 2017-07-24 DIAGNOSIS — M25661 Stiffness of right knee, not elsewhere classified: Secondary | ICD-10-CM | POA: Diagnosis not present

## 2017-07-24 DIAGNOSIS — M25642 Stiffness of left hand, not elsewhere classified: Secondary | ICD-10-CM | POA: Diagnosis not present

## 2017-07-24 DIAGNOSIS — M6281 Muscle weakness (generalized): Secondary | ICD-10-CM
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
## 2017-07-24 NOTE — Therapy (Signed)
[place], [place], [address] Phone: [phone]   Fax:  [phone]  Occupational Therapy Treatment  Patient Details  Name: Ann Patel MRN: [id number] Date of Birth: 1946/08/06 Referring Provider: Corene Cornea Heggerick, Pa-C  Encounter Date: 07/24/2017      OT End of Session - 07/24/17 1513    Visit Number 10   Number of Visits 18   Date for OT Re-Evaluation 08/04/17   Authorization Type [employer] Insurance being billed; pt is bringing information   OT Start Time 1429   OT Stop Time 1515   OT Time Calculation (min) 46 min   Activity Tolerance Patient tolerated treatment well   Behavior During Therapy [facility] for tasks assessed/performed      Past Medical History:  Diagnosis Date  . Coronary artery disease 09/2009   stress test EF 67%  . Hypercholesteremia   . Hypertension   . Myocardial infarction [facility])     Past Surgical History:  Procedure Laterality Date  . BREAST CYST EXCISION     fibro cyst removal  . CORONARY ANGIOPLASTY WITH STENT PLACEMENT Right 07/2005   receiving two consecutive drug eluting 2.5x51mm Taxus stents in the proximal to mid right coronary artery  . TONSILLECTOMY      There were no vitals filed for this visit.      Subjective Assessment - 07/24/17 1429    Subjective  S: I tied my shoes on Sunday.    Currently in Pain? No/denies            [facility] OT Assessment - 07/24/17 1429      Assessment   Diagnosis s/p ORIF for left distal radius and ulnar fracture     Precautions   Precautions None                  OT Treatments/Exercises (OP) - 07/24/17 1430      Exercises   Exercises Wrist;Hand     Weighted Stretch Over Towel Roll   Supination - Weighted Stretch 3 pounds;60 seconds  2 sets   Wrist Extension - Weighted Stretch 3 pounds;60 seconds  2 sets     Wrist Exercises   Forearm Supination PROM;AROM;10 reps   Forearm Pronation PROM;AROM;10 reps   Wrist  Flexion PROM;AROM;10 reps   Wrist Extension PROM;AROM;10 reps   Wrist Radial Deviation PROM;AROM;10 reps   Wrist Ulnar Deviation PROM;AROM;10 reps   Other wrist exercises left thumb CMC join moility; 10X P/ROM     Manual Therapy   Manual Therapy Myofascial release   Manual therapy comments completed separately from therapeutic exercises   Myofascial Release Myofascial release to left dorsal hand, wrist, dorsal and volar forearms to decrease pain and fascial restrictions and increase joint range of motion.                   OT Short Term Goals - 07/04/17 1406      OT SHORT TERM GOAL #1   Title Pt will be educated on HEP for improved use of LUE during B/IADL completion.    Time 3   Period Weeks   Status On-going     OT SHORT TERM GOAL #2   Title Pt will improve left hand/wrist P/ROM by 10 degrees to improve ability to use LUE as assist during dressing tasks.    Time 3   Period Weeks   Status On-going           OT Long Term Goals -  07/04/17 1407      OT LONG TERM GOAL #1   Title Pt will improve A/ROM of left hand and wrist by 15 degrees to improve ability to use LUE as assist during bathing tasks.    Time 6   Period Weeks   Status On-going     OT LONG TERM GOAL #2   Title Pt will increase functional use of LUE by using left hand for ADL completion a minimum of 25% of the time.    Time 6   Period Weeks   Status On-going     OT LONG TERM GOAL #3   Title Pt will improve left grip strength by 5# and pinch strength by 2# to improve ability to grasp and maintain hold of lightweight items.    Time 6   Period Weeks   Status On-going     OT LONG TERM GOAL #4   Title Pt will improve left wrist strength to 3-/5 to improve ability to use LUE as assist during meal preparation tasks.    Time 6   Period Weeks   Status On-going               Plan - 07/24/17 1511    Clinical Impression Statement A: Continued with manual therapy and passive stretching this  session, OT noting improved intrinsic muscle mobility in the palm this date. Increased weighted stretching to 3# and continued A/ROM. Pt reports she is trying not to baby her arm now and is trying to use it during daily tasks.    Plan P: continue with weighted stretching and continue with manual therapy working on wrist and forearm ROM   OT Home Exercise Plan 10/2: wrist/forearm A/ROM   Consulted and Agree with Plan of Care Patient      Patient will benefit from skilled therapeutic intervention in order to improve the following deficits and impairments:  Increased muscle spasms, Decreased scar mobility, Impaired flexibility, Decreased strength, Decreased activity tolerance, Decreased mobility, Pain, Increased edema, Impaired tone, Decreased range of motion, Decreased coordination, Increased fascial restricitons, Impaired UE functional use  Visit Diagnosis: Other symptoms and signs involving the musculoskeletal system  Stiffness of left wrist, not elsewhere classified  Stiffness of left hand, not elsewhere classified    Problem List Patient Active Problem List   Diagnosis Date Noted  . Left carotid bruit 03/15/2013  . Aortic valve stenosis, mild 03/15/2013  . Essential hypertension   . Hypercholesteremia   . Coronary artery disease 09/02/2009   Guadelupe Sabin, OTR/L  [phone] 07/24/2017, 3:16 PM  [place] [address] [place], [place], [address] Phone: [phone]   Fax:  [phone]  Name: Ann Patel MRN: [id number] Date of Birth: 1945/10/11

## 2017-07-26 ENCOUNTER — Ambulatory Visit (HOSPITAL_COMMUNITY): Payer: Commercial Managed Care - HMO

## 2017-07-26 ENCOUNTER — Encounter (HOSPITAL_COMMUNITY): Payer: Self-pay

## 2017-07-26 ENCOUNTER — Encounter (HOSPITAL_COMMUNITY): Payer: Self-pay | Admitting: Occupational Therapy

## 2017-07-26 ENCOUNTER — Ambulatory Visit (HOSPITAL_COMMUNITY): Payer: Commercial Managed Care - HMO | Admitting: Occupational Therapy

## 2017-07-26 DIAGNOSIS — M25632 Stiffness of left wrist, not elsewhere classified: Secondary | ICD-10-CM | POA: Diagnosis not present

## 2017-07-26 DIAGNOSIS — M25661 Stiffness of right knee, not elsewhere classified: Secondary | ICD-10-CM | POA: Diagnosis not present

## 2017-07-26 DIAGNOSIS — R29898 Other symptoms and signs involving the musculoskeletal system: Secondary | ICD-10-CM

## 2017-07-26 DIAGNOSIS — R262 Difficulty in walking, not elsewhere classified: Secondary | ICD-10-CM | POA: Diagnosis not present

## 2017-07-26 DIAGNOSIS — M6281 Muscle weakness (generalized): Secondary | ICD-10-CM | POA: Diagnosis not present

## 2017-07-26 DIAGNOSIS — M25642 Stiffness of left hand, not elsewhere classified: Secondary | ICD-10-CM | POA: Diagnosis not present

## 2017-07-26 NOTE — Therapy (Signed)
[place], [place], [address] Phone: [phone]   Fax:  [phone]  Occupational Therapy Treatment  Patient Details  Name: Ann Patel MRN: [id number] Date of Birth: 1946/05/31 Referring Provider: Corene Cornea Heggerick, Pa-C  Encounter Date: 07/26/2017      OT End of Session - 07/26/17 1345    Visit Number 11   Number of Visits 18   Date for OT Re-Evaluation 08/04/17   Authorization Type [employer] Insurance being billed; pt is bringing information   OT Start Time 1302   OT Stop Time 1345   OT Time Calculation (min) 43 min   Activity Tolerance Patient tolerated treatment well   Behavior During Therapy [facility] for tasks assessed/performed      Past Medical History:  Diagnosis Date  . Coronary artery disease 09/2009   stress test EF 67%  . Hypercholesteremia   . Hypertension   . Myocardial infarction [facility])     Past Surgical History:  Procedure Laterality Date  . BREAST CYST EXCISION     fibro cyst removal  . CORONARY ANGIOPLASTY WITH STENT PLACEMENT Right 07/2005   receiving two consecutive drug eluting 2.5x68mm Taxus stents in the proximal to mid right coronary artery  . TONSILLECTOMY      There were no vitals filed for this visit.      Subjective Assessment - 07/26/17 1301    Subjective  S: When I'm at home I have pain but I don't when I come in here.    Currently in Pain? No/denies            [facility] OT Assessment - 07/26/17 1301      Assessment   Diagnosis s/p ORIF for left distal radius and ulnar fracture     Precautions   Precautions None                  OT Treatments/Exercises (OP) - 07/26/17 1303      Exercises   Exercises Wrist;Hand     Weighted Stretch Over Towel Roll   Supination - Weighted Stretch 3 pounds;60 seconds  2 sets   Wrist Extension - Weighted Stretch 3 pounds;60 seconds  2 sets     Wrist Exercises   Forearm Supination PROM;AROM;10 reps   Forearm  Pronation PROM;AROM;10 reps   Wrist Flexion PROM;AROM;10 reps   Wrist Extension PROM;AROM;10 reps   Wrist Radial Deviation PROM;AROM;10 reps   Wrist Ulnar Deviation PROM;AROM;10 reps   Other wrist exercises left thumb CMC join moility; 10X P/ROM   Other wrist exercises velcro board working on supination/pronation. 2x up and down the narrow strip.      Hand Exercises   Opposition AROM;5 reps   Other Hand Exercises Thumb abduction towards palm, A/ROM 10X     Manual Therapy   Manual Therapy Myofascial release   Manual therapy comments completed separately from therapeutic exercises   Myofascial Release Myofascial release to left dorsal hand, wrist, dorsal and volar forearms to decrease pain and fascial restrictions and increase joint range of motion.                   OT Short Term Goals - 07/04/17 1406      OT SHORT TERM GOAL #1   Title Pt will be educated on HEP for improved use of LUE during B/IADL completion.    Time 3   Period Weeks   Status On-going     OT SHORT TERM GOAL #2  Title Pt will improve left hand/wrist P/ROM by 10 degrees to improve ability to use LUE as assist during dressing tasks.    Time 3   Period Weeks   Status On-going           OT Long Term Goals - 07/04/17 1407      OT LONG TERM GOAL #1   Title Pt will improve A/ROM of left hand and wrist by 15 degrees to improve ability to use LUE as assist during bathing tasks.    Time 6   Period Weeks   Status On-going     OT LONG TERM GOAL #2   Title Pt will increase functional use of LUE by using left hand for ADL completion a minimum of 25% of the time.    Time 6   Period Weeks   Status On-going     OT LONG TERM GOAL #3   Title Pt will improve left grip strength by 5# and pinch strength by 2# to improve ability to grasp and maintain hold of lightweight items.    Time 6   Period Weeks   Status On-going     OT LONG TERM GOAL #4   Title Pt will improve left wrist strength to 3-/5 to  improve ability to use LUE as assist during meal preparation tasks.    Time 6   Period Weeks   Status On-going               Plan - 07/26/17 1335    Clinical Impression Statement A: Continued with manual therapy and passive stretching, 3# weighted stretch. Pt with slight gain in supination this session with A/ROM. Pt encouraged to stretch arm to end ROM with A/ROM exercises. Added velcro board, good pronation, mod-max difficulty with supination. Verbal cuing not to compensate with shoulder.    Plan P: continue with manual therapy and weighted stretching, functional reaching activity wth clothespins   OT Home Exercise Plan 10/2: wrist/forearm A/ROM   Consulted and Agree with Plan of Care Patient      Patient will benefit from skilled therapeutic intervention in order to improve the following deficits and impairments:  Increased muscle spasms, Decreased scar mobility, Impaired flexibility, Decreased strength, Decreased activity tolerance, Decreased mobility, Pain, Increased edema, Impaired tone, Decreased range of motion, Decreased coordination, Increased fascial restricitons, Impaired UE functional use  Visit Diagnosis: Other symptoms and signs involving the musculoskeletal system  Stiffness of left wrist, not elsewhere classified  Stiffness of left hand, not elsewhere classified    Problem List Patient Active Problem List   Diagnosis Date Noted  . Left carotid bruit 03/15/2013  . Aortic valve stenosis, mild 03/15/2013  . Essential hypertension   . Hypercholesteremia   . Coronary artery disease 09/02/2009   Guadelupe Patel, OTR/L  [phone] 07/26/2017, 1:47 PM  [place] [address] [place], [place], [address] Phone: [phone]   Fax:  [phone]  Name: Ann Patel MRN: [id number] Date of Birth: May 19, 1946

## 2017-07-26 NOTE — Therapy (Signed)
[place], [place], [address] Phone: [phone]   Fax:  [phone]  Physical Therapy Treatment  Patient Details  Name: Ann Patel MRN: [id number] Date of Birth: Jan 23, 1946 Referring Provider: Corene Cornea Heggerick, PA-C  Encounter Date: 07/26/2017      PT End of Session - 07/26/17 1351    Visit Number 10   Number of Visits 13   Date for PT Re-Evaluation 08/02/17   Authorization Type [employer] (g-codes done on 05-Dec-2022 visit)   Authorization Time Period 06/21/17 to 08/02/17   Authorization - Visit Number 10   Authorization - Number of Visits 13   PT Start Time 3818   PT Stop Time 1430   PT Time Calculation (min) 41 min   Activity Tolerance Patient tolerated treatment well;No increased pain   Behavior During Therapy WFL for tasks assessed/performed      Past Medical History:  Diagnosis Date  . Coronary artery disease 09/2009   stress test EF 67%  . Hypercholesteremia   . Hypertension   . Myocardial infarction [facility])     Past Surgical History:  Procedure Laterality Date  . BREAST CYST EXCISION     fibro cyst removal  . CORONARY ANGIOPLASTY WITH STENT PLACEMENT Right 07/2005   receiving two consecutive drug eluting 2.5x93m Taxus stents in the proximal to mid right coronary artery  . TONSILLECTOMY      There were no vitals filed for this visit.      Subjective Assessment - 07/26/17 1351    Subjective Pt stated knee is stiff today, no reports of pain.     Patient Stated Goals to get back like I was   Currently in Pain? No/denies                         [facility] - 07/26/17 1416                           Knee/Hip Exercises: Stretches   Active Hamstring Stretch Left;3 reps;30 seconds   Active Hamstring Stretch Limitations supine   Knee: Self-Stretch to increase Flexion Left;10 seconds   Knee: Self-Stretch Limitations x10    Gastroc Stretch  Both;3 reps;30 seconds   Gastroc Stretch Limitations slant board     Knee/Hip Exercises: Aerobic   Stationary Bike bike for mobility at beginning of session 3' no charge     Knee/Hip Exercises: MField seismologistfor SCounselling psychologistPROM: 90% ext and 90% flexion (started at 75% but was able to quickly increase)     Knee/Hip Exercises: Supine   Quad Sets 15 reps   Short Arc Quad Sets 15 reps   Heel Slides 5 reps   Knee Extension Limitations 10   Knee Flexion Limitations 106 degrees following manual                                       Manual Therapy   Manual Therapy Soft tissue mobilization;Joint mobilization;Passive ROM   Manual therapy comments completed separately from therapeutic exercises   Joint Mobilization patella all directions    Soft tissue mobilization Lt quad and medial hamstring   Passive ROM to knee to increase ROM                PT Education -  07/26/17 1418    Education provided Yes   Education Details Called JAS brace concerning communication with pt.  JAS given new phone number   Person(s) Educated Other (comment)  JAS brace, correct phone number to contact pt   Methods Explanation   Comprehension Verbalized understanding          PT Short Term Goals - 07/11/17 1526      PT SHORT TERM GOAL #1   Title Pt will be independent with HEP and perform consistently to maximize return to PLOF.   Time 3   Period Weeks   Status Achieved     PT SHORT TERM GOAL #2   Title Pt will have improved L knee AROM at least 5-110 to maximize gait.    Baseline 10/9: 12-110   Time 3   Period Weeks   Status Partially Met     PT SHORT TERM GOAL #3   Title Pt will have improved 5xSTS to <10 sec with proper mechanics and weight shift over LLE to demo improved functional strength.   Time 3   Period Weeks   Status Achieved           PT Long Term Goals - 07/11/17 1526      PT LONG TERM GOAL #1   Title Pt will have improved L knee AROM to  0-120 to maximize gait and return to PLOF.   Baseline 10/9: 12-110   Time 6   Period Weeks   Status On-going     PT LONG TERM GOAL #2   Title Pt will have improved TUG to 10 sec or < with no AD to demo improved gait and community participation.   Baseline 10/9: 8.3 sec no AD, but with increased gait deviations   Time 6   Period Weeks   Status Partially Met     PT LONG TERM GOAL #3   Title Pt will have improved SLS on L to at least 15 sec with min to no unsteadiness to demo improved balance and maximize gait on uneven ground   Baseline 10/9: 16 sec LLE    Time 6   Period Weeks   Status Achieved     PT LONG TERM GOAL #4   Title Pt will have improved 3MWT to 674f or > with LRAD and with proper gait mechanics to demo improved overall function.    Baseline 10/9: 7394f no AD but with increased gait mechanics   Time 6   Period Weeks   Status Partially Met     PT LONG TERM GOAL #5   Title Pt will report being able to return to her regular walking program at least 3x/week to demo improved tolerance to activity, gait, balance, and strength.    Time 6   Period Weeks   Status Achieved               Plan - 07/26/17 1705    Clinical Impression Statement Pt reports she has not been contacted by JAS brace, therapist called and spoke to front office who report leaving message on Monday.  Reviewed contact information and given pt current phone number.  Cotninued session focus with knee mobilty with manual STM to address tightness in medial hamstrings and quad, PROM per pt tolerance, stretches and ended session with [employer].  AROM 10-106 degrees at EOS.     Rehab Potential Good   PT Frequency 2x / week   PT Duration 6 weeks   PT Treatment/Interventions ADLs/Self Care  Home Management;Cryotherapy;Electrical Stimulation;Moist Heat;Ultrasound;DME Instruction;Gait training;Stair training;Functional mobility training;Therapeutic activities;Therapeutic exercise;Balance training;Neuromuscular  re-education;Patient/family education;Manual techniques;Scar mobilization;Passive range of motion;Dry needling;Energy conservation;Taping   PT Next Visit Plan Continue to improve functional strengthening and knee AROM.  Manual techniques to improve tissue mobility, joint and patellar mobs.  Follow up on JAS brace - faxed 07/18/2017.    PT Home Exercise Plan eval: quad sets, heel slides; 07/06/17 seated knee flexion sustained; 10/9: standing calf stretch on step, knee drives on step, supine prolonged knee extension stretch with weight; 10/18: heel and toe raises      Patient will benefit from skilled therapeutic intervention in order to improve the following deficits and impairments:  Abnormal gait, Decreased activity tolerance, Decreased balance, Decreased endurance, Decreased mobility, Decreased range of motion, Decreased strength, Difficulty walking, Hypomobility, Increased fascial restricitons, Increased muscle spasms, Impaired flexibility, Impaired UE functional use, Improper body mechanics  Visit Diagnosis: Stiffness of right knee, not elsewhere classified  Muscle weakness (generalized)  Difficulty in walking, not elsewhere classified     Problem List Patient Active Problem List   Diagnosis Date Noted  . Left carotid bruit 03/15/2013  . Aortic valve stenosis, mild 03/15/2013  . Essential hypertension   . Hypercholesteremia   . Coronary artery disease 09/02/2009   Ihor Austin, [place]; [place]  Aldona Lento 07/26/2017, 5:09 PM  [place] [address] [place], [place], [address] Phone: [phone]   Fax:  [phone]  Name: Ann Patel MRN: [id number] Date of Birth: May 03, 1946

## 2017-07-28 ENCOUNTER — Encounter (HOSPITAL_COMMUNITY): Payer: Self-pay | Admitting: Occupational Therapy

## 2017-07-28 ENCOUNTER — Ambulatory Visit (HOSPITAL_COMMUNITY): Payer: Commercial Managed Care - HMO | Admitting: Occupational Therapy

## 2017-07-28 DIAGNOSIS — M25642 Stiffness of left hand, not elsewhere classified: Secondary | ICD-10-CM

## 2017-07-28 DIAGNOSIS — R29898 Other symptoms and signs involving the musculoskeletal system: Secondary | ICD-10-CM | POA: Diagnosis not present

## 2017-07-28 DIAGNOSIS — R262 Difficulty in walking, not elsewhere classified: Secondary | ICD-10-CM | POA: Diagnosis not present

## 2017-07-28 DIAGNOSIS — M25661 Stiffness of right knee, not elsewhere classified: Secondary | ICD-10-CM | POA: Diagnosis not present

## 2017-07-28 DIAGNOSIS — M25632 Stiffness of left wrist, not elsewhere classified: Secondary | ICD-10-CM

## 2017-07-28 DIAGNOSIS — M6281 Muscle weakness (generalized): Secondary | ICD-10-CM | POA: Diagnosis not present

## 2017-07-28 NOTE — Therapy (Signed)
[place], [place], [address] Phone: [phone]   Fax:  [phone]  Occupational Therapy Treatment  Patient Details  Name: Ann Patel MRN: [id number] Date of Birth: 12-16-1945 Referring Provider: Corene Cornea Heggerick, Pa-C  Encounter Date: 07/28/2017      OT End of Session - 07/28/17 1551    Visit Number 12   Number of Visits 18   Date for OT Re-Evaluation 08/04/17   Authorization Type [employer] being billed; pt is bringing information   OT Start Time 1433   OT Stop Time 1518   OT Time Calculation (min) 45 min   Activity Tolerance Patient tolerated treatment well   Behavior During Therapy [facility] for tasks assessed/performed      Past Medical History:  Diagnosis Date  . Coronary artery disease 09/2009   stress test EF 67%  . Hypercholesteremia   . Hypertension   . Myocardial infarction [facility])     Past Surgical History:  Procedure Laterality Date  . BREAST CYST EXCISION     fibro cyst removal  . CORONARY ANGIOPLASTY WITH STENT PLACEMENT Right 07/2005   receiving two consecutive drug eluting 2.5x66mm Taxus stents in the proximal to mid right coronary artery  . TONSILLECTOMY      There were no vitals filed for this visit.      Subjective Assessment - 07/28/17 1433    Subjective  S: It doesn't hurt here, only hurts at home sometimes.    Currently in Pain? No/denies            [facility] OT Assessment - 07/28/17 1433      Assessment   Diagnosis s/p ORIF for left distal radius and ulnar fracture     Precautions   Precautions None                  OT Treatments/Exercises (OP) - 07/28/17 1435      Exercises   Exercises Wrist;Hand     Wrist Exercises   Forearm Supination PROM;AROM;10 reps   Forearm Pronation PROM;AROM;10 reps   Wrist Flexion PROM;AROM;10 reps   Wrist Extension PROM;AROM;10 reps   Wrist Radial Deviation PROM;AROM;10 reps   Wrist Ulnar Deviation PROM;AROM;10  reps     Additional Wrist Exercises   Sponges Pt used left hand and 3 point pinch with thumb 3rd and 4th digits to grasp and stack sponges using red clothespin. Pt stacked 2 piles of 6 sponges.      Manual Therapy   Manual Therapy Myofascial release   Manual therapy comments completed separately from therapeutic exercises   Myofascial Release Myofascial release to left dorsal hand, wrist, dorsal and volar forearms to decrease pain and fascial restrictions and increase joint range of motion.                   OT Short Term Goals - 07/04/17 1406      OT SHORT TERM GOAL #1   Title Pt will be educated on HEP for improved use of LUE during B/IADL completion.    Time 3   Period Weeks   Status On-going     OT SHORT TERM GOAL #2   Title Pt will improve left hand/wrist P/ROM by 10 degrees to improve ability to use LUE as assist during dressing tasks.    Time 3   Period Weeks   Status On-going           OT Long Term Goals - 07/04/17 1407  OT LONG TERM GOAL #1   Title Pt will improve A/ROM of left hand and wrist by 15 degrees to improve ability to use LUE as assist during bathing tasks.    Time 6   Period Weeks   Status On-going     OT LONG TERM GOAL #2   Title Pt will increase functional use of LUE by using left hand for ADL completion a minimum of 25% of the time.    Time 6   Period Weeks   Status On-going     OT LONG TERM GOAL #3   Title Pt will improve left grip strength by 5# and pinch strength by 2# to improve ability to grasp and maintain hold of lightweight items.    Time 6   Period Weeks   Status On-going     OT LONG TERM GOAL #4   Title Pt will improve left wrist strength to 3-/5 to improve ability to use LUE as assist during meal preparation tasks.    Time 6   Period Weeks   Status On-going               Plan - 07/28/17 1510    Clinical Impression Statement A: Added sponged stacking task using red clothespin, pt with mod difficulty  initially however improving during task. Pt unable to complete task with thumb and first 2 digits. Continued with manual therapy and A/ROM, occasional verbal cuing for form and technique. Unable to complete weighted stretches due to time constraints.    Plan P: Resumed missed exercises and weighted stretches   OT Home Exercise Plan 10/2: wrist/forearm A/ROM   Consulted and Agree with Plan of Care Patient      Patient will benefit from skilled therapeutic intervention in order to improve the following deficits and impairments:  Increased muscle spasms, Decreased scar mobility, Impaired flexibility, Decreased strength, Decreased activity tolerance, Decreased mobility, Pain, Increased edema, Impaired tone, Decreased range of motion, Decreased coordination, Increased fascial restricitons, Impaired UE functional use  Visit Diagnosis: Other symptoms and signs involving the musculoskeletal system  Stiffness of left wrist, not elsewhere classified  Stiffness of left hand, not elsewhere classified    Problem List Patient Active Problem List   Diagnosis Date Noted  . Left carotid bruit 03/15/2013  . Aortic valve stenosis, mild 03/15/2013  . Essential hypertension   . Hypercholesteremia   . Coronary artery disease 09/02/2009   Ann Patel, OTR/L  [phone] 07/28/2017, 3:52 PM  [place] [address] [place], [place], [address] Phone: [phone]   Fax:  [phone]  Name: Ann Patel MRN: [id number] Date of Birth: 04/09/1946

## 2017-07-31 ENCOUNTER — Ambulatory Visit (HOSPITAL_COMMUNITY): Payer: Commercial Managed Care - HMO

## 2017-07-31 ENCOUNTER — Encounter (HOSPITAL_COMMUNITY): Payer: Self-pay

## 2017-07-31 ENCOUNTER — Ambulatory Visit (HOSPITAL_COMMUNITY): Payer: Commercial Managed Care - HMO | Admitting: Specialist

## 2017-07-31 ENCOUNTER — Encounter (HOSPITAL_COMMUNITY): Payer: Self-pay | Admitting: Specialist

## 2017-07-31 DIAGNOSIS — M25632 Stiffness of left wrist, not elsewhere classified: Secondary | ICD-10-CM

## 2017-07-31 DIAGNOSIS — R29898 Other symptoms and signs involving the musculoskeletal system: Secondary | ICD-10-CM | POA: Diagnosis not present

## 2017-07-31 DIAGNOSIS — M6281 Muscle weakness (generalized): Secondary | ICD-10-CM

## 2017-07-31 DIAGNOSIS — M25661 Stiffness of right knee, not elsewhere classified: Secondary | ICD-10-CM | POA: Diagnosis not present

## 2017-07-31 DIAGNOSIS — R262 Difficulty in walking, not elsewhere classified: Secondary | ICD-10-CM

## 2017-07-31 DIAGNOSIS — M25642 Stiffness of left hand, not elsewhere classified: Secondary | ICD-10-CM

## 2017-07-31 NOTE — Therapy (Signed)
[place], [place], [address] Phone: [phone]   Fax:  [phone]  Physical Therapy Treatment  Patient Details  Name: Ann Patel MRN: [id number] Date of Birth: 10/13/1945 Referring Provider: Corene Cornea Heggerick, PA-C  Encounter Date: 07/31/2017      PT End of Session - 07/31/17 1439    Visit Number 12  End of session #s not completed on 07/13/17 so corrected pt's visit count this date   Number of Visits 13   Date for PT Re-Evaluation 08/02/17   Authorization Type [employer] H[employer] (g-codes done on 2023/01/03 visit)   Authorization Time Period 06/21/17 to 08/02/17   Authorization - Visit Number 12   Authorization - Number of Visits 13   PT Start Time 6237  pt late   PT Stop Time 1519   PT Time Calculation (min) 42 min   Activity Tolerance Patient tolerated treatment well;No increased pain   Behavior During Therapy WFL for tasks assessed/performed      Past Medical History:  Diagnosis Date  . Coronary artery disease 09/2009   stress test EF 67%  . Hypercholesteremia   . Hypertension   . Myocardial infarction [facility])     Past Surgical History:  Procedure Laterality Date  . BREAST CYST EXCISION     fibro cyst removal  . CORONARY ANGIOPLASTY WITH STENT PLACEMENT Right 07/2005   receiving two consecutive drug eluting 2.5x73m Taxus stents in the proximal to mid right coronary artery  . TONSILLECTOMY      There were no vitals filed for this visit.      Subjective Assessment - 07/31/17 1439    Subjective Pt states that her knee is a little more stiff today but denies nay pain.   Patient Stated Goals to get back like I was   Currently in Pain? No/denies                 [facility] 07/[facility]/17 0001      Knee/Hip Exercises: Stretches   Active Hamstring Stretch Left;3 reps;30 seconds   Active Hamstring Stretch Limitations 12" step   Gastroc Stretch Both;3 reps;30  seconds   Gastroc Stretch Limitations slant board   Other Knee/Hip Stretches knee drives on step [id number] holds     Knee/Hip Exercises: Standing   Knee Flexion Left;15 reps   Knee Flexion Limitations 2-3" holds   Terminal Knee Extension Limitations 10x5" holds with BTB     Knee/Hip Exercises: Seated   Stool Scoot - Round Trips fwd/retro, LLE only x3RT   Other Seated Knee/Hip Exercises contract relax for flexion 5x10" holds     Knee/Hip Exercises: Supine   Quad Sets 10 reps   Quad Sets Limitations with manual OP   Knee Extension Limitations 9   Knee Flexion Limitations 112 after manual     Manual Therapy   Manual Therapy Soft tissue mobilization;Passive ROM   Manual therapy comments completed separately from therapeutic exercises                PT Education - 07/31/17 1440    Education provided Yes   Education Details provided # for JAS brace rep   Person(s) Educated Patient   Methods Explanation;Demonstration   Comprehension Verbalized understanding;Returned demonstration          PT Short Term Goals - 07/11/17 1526      PT SHORT TERM GOAL #1   Title Pt will be independent with HEP and  perform consistently to maximize return to PLOF.   Time 3   Period Weeks   Status Achieved     PT SHORT TERM GOAL #2   Title Pt will have improved L knee AROM at least 5-110 to maximize gait.    Baseline 10/9: 12-110   Time 3   Period Weeks   Status Partially Met     PT SHORT TERM GOAL #3   Title Pt will have improved 5xSTS to <10 sec with proper mechanics and weight shift over LLE to demo improved functional strength.   Time 3   Period Weeks   Status Achieved           PT Long Term Goals - 07/11/17 1526      PT LONG TERM GOAL #1   Title Pt will have improved L knee AROM to 0-120 to maximize gait and return to PLOF.   Baseline 10/9: 12-110   Time 6   Period Weeks   Status On-going     PT LONG TERM GOAL #2   Title Pt will have improved TUG to 10 sec or <  with no AD to demo improved gait and community participation.   Baseline 10/9: 8.3 sec no AD, but with increased gait deviations   Time 6   Period Weeks   Status Partially Met     PT LONG TERM GOAL #3   Title Pt will have improved SLS on L to at least 15 sec with min to no unsteadiness to demo improved balance and maximize gait on uneven ground   Baseline 10/9: 16 sec LLE    Time 6   Period Weeks   Status Achieved     PT LONG TERM GOAL #4   Title Pt will have improved to 634ft or > with LRAD and with proper gait mechanics to demo improved overall function.    Baseline 10/9: 737ft, no AD but with increased gait mechanics   Time 6   Period Weeks   Status Partially Met     PT LONG TERM GOAL #5   Title Pt will report being able to return to her regular walking program at least 3x/week to demo improved tolerance to activity, gait, balance, and strength.    Time 6   Period Weeks   Status Achieved               Plan - 07/31/17 1521    Clinical Impression Statement Pt states that she has still not heard from the JAS brace representative; PT provided pt with rep's phone number and she stated she would call him this afternoon. Session focused on functional strengthening and improving mobility of L knee. Pt AROM 9-112 this date following manual. Pt due for reassessment next visit.   Rehab Potential Good   PT Frequency 2x / week   PT Duration 6 weeks   PT Treatment/Interventions ADLs/Self Care Home Management;Cryotherapy;Electrical Stimulation;Moist Heat;Ultrasound;DME Instruction;Gait training;Stair training;Functional mobility training;Therapeutic activities;Therapeutic exercise;Balance training;Neuromuscular re-education;Patient/family education;Manual techniques;Scar mobilization;Passive range of motion;Dry needling;Energy conservation;Taping   PT Next Visit Plan Reassess; Continue to improve functional strengthening and knee AROM.  Manual techniques to improve tissue mobility,  joint and patellar mobs.  Follow up on JAS brace - faxed 07/18/2017.    PT Home Exercise Plan eval: quad sets, heel slides; 07/06/17 seated knee flexion sustained; 10/9: standing calf stretch on step, knee drives on step, supine prolonged knee extension stretch with weight; 10/18: heel and toe raises   Consulted and Agree  with Plan of Care Patient      Patient will benefit from skilled therapeutic intervention in order to improve the following deficits and impairments:  Abnormal gait, Decreased activity tolerance, Decreased balance, Decreased endurance, Decreased mobility, Decreased range of motion, Decreased strength, Difficulty walking, Hypomobility, Increased fascial restricitons, Increased muscle spasms, Impaired flexibility, Impaired UE functional use, Improper body mechanics  Visit Diagnosis: Stiffness of right knee, not elsewhere classified  Muscle weakness (generalized)  Difficulty in walking, not elsewhere classified  Other symptoms and signs involving the musculoskeletal system     Problem List Patient Active Problem List   Diagnosis Date Noted  . Left carotid bruit 03/15/2013  . Aortic valve stenosis, mild 03/15/2013  . Essential hypertension   . Hypercholesteremia   . Coronary artery disease 09/02/2009        Geraldine Solar PT, DPT  [place] [address] [place], [place], [address] Phone: [phone]   Fax:  [phone]  Name: Ann Patel MRN: [id number] Date of Birth: [birth year]

## 2017-07-31 NOTE — Therapy (Signed)
Ann Patel, [place], [address] Phone: [phone]   Fax:  [phone]  Occupational Therapy Treatment  Patient Details  Name: Ann Patel MRN: [id number] Date of Birth: December 29, 1945 Referring Provider: Corene Cornea Heggerick, Pa-C  Encounter Date: 07/31/2017      OT End of Session - 07/31/17 1652    Visit Number 13   Number of Visits 18   Date for OT Re-Evaluation 08/04/17   OT Start Time 1520   OT Stop Time 1605   OT Time Calculation (min) 45 min   Activity Tolerance Patient tolerated treatment well   Behavior During Therapy [place] [facility] for tasks assessed/performed      Past Medical History:  Diagnosis Date  . Coronary artery disease 09/2009   stress test EF 67%  . Hypercholesteremia   . Hypertension   . Myocardial infarction [facility])     Past Surgical History:  Procedure Laterality Date  . BREAST CYST EXCISION     fibro cyst removal  . CORONARY ANGIOPLASTY WITH STENT PLACEMENT Right 07/2005   receiving two consecutive drug eluting 2.5x26mm Taxus stents in the proximal to mid right coronary artery  . TONSILLECTOMY      There were no vitals filed for this visit.      Subjective Assessment - 07/31/17 1652    Subjective  S:  I like the way we do this move (supination with arm at side vis outstretched)   Currently in Pain? No/denies            [facility] OT Assessment - 07/31/17 0001      Assessment   Diagnosis s/p ORIF for left distal radius and ulnar fracture     Precautions   Precautions None                  OT Treatments/Exercises (OP) - 07/31/17 1654      Exercises   Exercises Wrist;Hand     Weighted Stretch Over Towel Roll   Supination - Weighted Stretch 3 pounds;60 seconds   Wrist Extension - Weighted Stretch 3 pounds;60 seconds     Wrist Exercises   Forearm Supination PROM;AROM;10 reps   Forearm Supination Limitations with arm to side vs outstretched    Forearm Pronation  PROM;AROM;10 reps   Forearm Pronation Limitations with arm to side vs outstretched    Wrist Flexion PROM;AROM;10 reps   Wrist Extension PROM;AROM;10 reps   Wrist Radial Deviation PROM;AROM;10 reps   Wrist Ulnar Deviation PROM;AROM;10 reps     Additional Wrist Exercises   Sponges in hand manipulation 5, 5 , 10 with min vg     Hand Exercises   MCPJ Flexion AROM;5 reps   Joint Blocking Exercises p/rom all digits and joints   Opposition AROM;5 reps     Manual Therapy   Manual Therapy Myofascial release   Manual therapy comments completed separately from therapeutic exercises   Myofascial Release Myofascial release to left dorsal hand, wrist, dorsal and volar forearms to decrease pain and fascial restrictions and increase joint range of motion.                   OT Short Term Goals - 07/04/17 1406      OT SHORT TERM GOAL #1   Title Pt will be educated on HEP for improved use of LUE during B/IADL completion.    Time 3   Period Weeks   Status On-going     OT SHORT TERM GOAL #2  Title Pt will improve left hand/wrist P/ROM by 10 degrees to improve ability to use LUE as assist during dressing tasks.    Time 3   Period Weeks   Status On-going           OT Long Term Goals - 07/04/17 1407      OT LONG TERM GOAL #1   Title Pt will improve A/ROM of left hand and wrist by 15 degrees to improve ability to use LUE as assist during bathing tasks.    Time 6   Period Weeks   Status On-going     OT LONG TERM GOAL #2   Title Pt will increase functional use of LUE by using left hand for ADL completion a minimum of 25% of the time.    Time 6   Period Weeks   Status On-going     OT LONG TERM GOAL #3   Title Pt will improve left grip strength by 5# and pinch strength by 2# to improve ability to grasp and maintain hold of lightweight items.    Time 6   Period Weeks   Status On-going     OT LONG TERM GOAL #4   Title Pt will improve left wrist strength to 3-/5 to improve  ability to use LUE as assist during meal preparation tasks.    Time 6   Period Weeks   Status On-going               Plan - 07/31/17 1653    Clinical Impression Statement A:  Able to improve from 5 sponges to 10 sponges with in hand manipulation tasks this date with verbal guidance for technique.     Plan P:  Recertification, improve to 15 sponges with in hand manipulation task.       Patient will benefit from skilled therapeutic intervention in order to improve the following deficits and impairments:     Visit Diagnosis: Other symptoms and signs involving the musculoskeletal system  Stiffness of left wrist, not elsewhere classified  Stiffness of left hand, not elsewhere classified    Problem List Patient Active Problem List   Diagnosis Date Noted  . Left carotid bruit 03/15/2013  . Aortic valve stenosis, mild 03/15/2013  . Essential hypertension   . Hypercholesteremia   . Coronary artery disease 09/02/2009    Vangie Bicker, [place], OTR/L [phone]  07/31/2017, 4:56 PM  [facility] [address] [place], [place], [address] Phone: [phone]   Fax:  [phone]  Name: Ann Patel MRN: [id number] Date of Birth: June 05, 1946

## 2017-08-02 ENCOUNTER — Ambulatory Visit (HOSPITAL_COMMUNITY): Payer: Commercial Managed Care - HMO | Admitting: Occupational Therapy

## 2017-08-02 ENCOUNTER — Encounter (HOSPITAL_COMMUNITY): Payer: Self-pay

## 2017-08-02 ENCOUNTER — Ambulatory Visit (HOSPITAL_COMMUNITY): Payer: Commercial Managed Care - HMO

## 2017-08-02 ENCOUNTER — Encounter (HOSPITAL_COMMUNITY): Payer: Self-pay | Admitting: Occupational Therapy

## 2017-08-02 DIAGNOSIS — R29898 Other symptoms and signs involving the musculoskeletal system: Secondary | ICD-10-CM

## 2017-08-02 DIAGNOSIS — M25642 Stiffness of left hand, not elsewhere classified: Secondary | ICD-10-CM

## 2017-08-02 DIAGNOSIS — M25661 Stiffness of right knee, not elsewhere classified: Secondary | ICD-10-CM

## 2017-08-02 DIAGNOSIS — M6281 Muscle weakness (generalized): Secondary | ICD-10-CM | POA: Diagnosis not present

## 2017-08-02 DIAGNOSIS — M25632 Stiffness of left wrist, not elsewhere classified: Secondary | ICD-10-CM

## 2017-08-02 DIAGNOSIS — R262 Difficulty in walking, not elsewhere classified: Secondary | ICD-10-CM

## 2017-08-02 NOTE — Therapy (Signed)
[place], [place], [address] Phone: [phone]   Fax:  [phone]  Physical Therapy Treatment/ Re-Evaluation  Patient Details  Name: Ann Patel MRN: [id number] Date of Birth: 12-Dec-1945 Referring Provider: Corene Cornea Heggerick, PA-C  Encounter Date: 08/02/2017      PT End of Session - 08/02/17 1401    Visit Number 13  End of session #s not completed on 07/13/17 so corrected pt's visit count this date   Number of Visits 13   Date for PT Re-Evaluation 09/01/17   Authorization Type [employer] (g-codes done on 11-14-2022 visit)   Authorization Time Period 06/21/17 to 08/02/17; New auth 08/02/2017-09/15/2017   Authorization - Visit Number 12   Authorization - Number of Visits 13   PT Start Time 1300   PT Stop Time 1350   PT Time Calculation (min) 50 min   Activity Tolerance Patient tolerated treatment well;No increased pain   Behavior During Therapy WFL for tasks assessed/performed      Past Medical History:  Diagnosis Date  . Coronary artery disease 09/2009   stress test EF 67%  . Hypercholesteremia   . Hypertension   . Myocardial infarction [facility])     Past Surgical History:  Procedure Laterality Date  . BREAST CYST EXCISION     fibro cyst removal  . CORONARY ANGIOPLASTY WITH STENT PLACEMENT Right 07/2005   receiving two consecutive drug eluting 2.5x37m Taxus stents in the proximal to mid right coronary artery  . TONSILLECTOMY      There were no vitals filed for this visit.      Subjective Assessment - 08/02/17 1303    Subjective Pt states that she is feeling stiff but no pain overall. Pt notes that she has noticed progression since beginning therapy, being able to do some steps she wasn't able to do and walks a little better with the cane and occasionally without in her home.    Currently in Pain? No/denies            [facility]      Balance Screen   Has the  patient fallen in the past 6 months No   Has the patient had a decrease in activity level because of a fear of falling?  No   Is the patient reluctant to leave their home because of a fear of falling?  No     Functional Tests   Functional tests Sit to Stand;Single leg stance     Single Leg Stance   Comments Lt 16 seconds     Sit to Stand   Comments no UE assist 12 seconds      AROM   Left Knee Extension 12  10 with over pressure   Left Knee Flexion 110  112 overpressure     Strength   Left Knee Flexion 5/5   Left Knee Extension 4+/5   Right Ankle Dorsiflexion 5/5   Left Ankle Dorsiflexion 4+/5     Ambulation/Gait   Ambulation Distance (Feet) 818 Feet  3MWT     Timed Up and Go Test   Normal TUG (seconds) 11  no AD used                     [facility] Adult PT Treatment/Exercise - 08/02/17 0001      Ambulation/Gait   Assistive device None   Gait Pattern Step-through pattern;Decreased arm swing - left;Decreased hip/knee flexion - left  Knee/Hip Exercises: Stretches   Active Hamstring Stretch Left;3 reps;30 seconds   Active Hamstring Stretch Limitations 12" step   Gastroc Stretch Both;3 reps;30 seconds   Gastroc Stretch Limitations slant board   Other Knee/Hip Stretches knee drives on step [id number]" holds     Knee/Hip Exercises: Aerobic   Stationary Bike Bike for mobility EOS x 3 minutes   seat 6     Knee/Hip Exercises: Standing   Gait Training SPC training with heel toe x 40 ft.                   PT Short Term Goals - 08/02/17 1406      PT SHORT TERM GOAL #1   Title Pt will be independent with HEP and perform consistently to maximize return to PLOF.   Time 3   Period Weeks   Status Achieved     PT SHORT TERM GOAL #2   Title Pt will have improved L knee AROM at least 5-110 to maximize gait.    Baseline 10/31: 12-110   Time 3   Period Weeks   Status Partially Met     PT SHORT TERM GOAL #3   Title Pt will have improved 5xSTS to <10  sec with proper mechanics and weight shift over LLE to demo improved functional strength.   Time 3   Period Weeks   Status Achieved           PT Long Term Goals - 08/02/17 1407      PT LONG TERM GOAL #1   Title Pt will have improved L knee AROM to 0-120 to maximize gait and return to PLOF.   Baseline 10/31: 12-110   Time 6   Period Weeks   Status On-going     PT LONG TERM GOAL #2   Title Pt will have improved TUG to 10 sec or < with no AD to demo improved gait and community participation.   Baseline 10/31: 11 sec, no AD, improved gait deviation from last assessment    Time 6   Period Weeks   Status Partially Met     PT LONG TERM GOAL #3   Title Pt will have improved SLS on L to at least 15 sec with min to no unsteadiness to demo improved balance and maximize gait on uneven ground   Baseline 10/9: 16 sec LLE    Time 6   Period Weeks   Status Achieved     PT LONG TERM GOAL #4   Title Pt will have improved 3MWT to 632f or > with LRAD and with proper gait mechanics to demo improved overall function.    Baseline 10/31: 7346f no AD but with increased gait mechanics - improved fluency of strides    Time 6   Period Weeks   Status Partially Met     PT LONG TERM GOAL #5   Title Pt will report being able to return to her regular walking program at least 3x/week to demo improved tolerance to activity, gait, balance, and strength.    Time 6   Period Weeks   Status Achieved               Plan - 08/02/17 1402    Clinical Impression Statement Re-evaluation was completed this visit to assess progress this far. Patient has made good functional improvement with ROM, strength, balance and gait speed. She continues to have ROM limitations of 10-112 left knee. Strength improved of Lt. LE with minimal  deficits noted during MMT assessment. She made improvements in distance ambulated during 3MWT without using an AD, however, deficits continue secondary to knee ROM deficits. She  continues to demonstrate balance deficits and would benefit from continued skilled PT to address the ROM and balance deficits to continues working towards maximum function and improved independence.    Rehab Potential Good   PT Frequency 2x / week   PT Duration 6 weeks   PT Treatment/Interventions ADLs/Self Care Home Management;Cryotherapy;Electrical Stimulation;Moist Heat;Ultrasound;DME Instruction;Gait training;Stair training;Functional mobility training;Therapeutic activities;Therapeutic exercise;Balance training;Neuromuscular re-education;Patient/family education;Manual techniques;Scar mobilization;Passive range of motion;Dry needling;Energy conservation;Taping   PT Next Visit Plan Complete DGI next visit; Initiate balance as well as Continue to improve functional strengthening and knee AROM.  Manual techniques to improve tissue mobility, joint and patellar mobs.  Follow up on JAS brace - faxed 07/18/2017.    PT Home Exercise Plan eval: quad sets, heel slides; 07/06/17 seated knee flexion sustained; 10/9: standing calf stretch on step, knee drives on step, supine prolonged knee extension stretch with weight; 10/18: heel and toe raises   Consulted and Agree with Plan of Care Patient      Patient will benefit from skilled therapeutic intervention in order to improve the following deficits and impairments:  Abnormal gait, Decreased activity tolerance, Decreased balance, Decreased endurance, Decreased mobility, Decreased range of motion, Decreased strength, Difficulty walking, Hypomobility, Increased fascial restricitons, Increased muscle spasms, Impaired flexibility, Impaired UE functional use, Improper body mechanics  Visit Diagnosis: Stiffness of right knee, not elsewhere classified  Muscle weakness (generalized)  Difficulty in walking, not elsewhere classified     Problem List Patient Active Problem List   Diagnosis Date Noted  . Left carotid bruit 03/15/2013  . Aortic valve stenosis,  mild 03/15/2013  . Essential hypertension   . Hypercholesteremia   . Coronary artery disease 09/02/2009    [place] 08/02/2017, 2:14 PM  [facility] [address] [place], [place], [address] Phone: [phone]   Fax:  [phone]  Name: Ann Patel MRN: [id number] Date of Birth: [birth year]

## 2017-08-02 NOTE — Therapy (Signed)
[place], [place], [address] Phone: [phone]   Fax:  [phone]  Occupational Therapy Treatment  Patient Details  Name: Ann Patel MRN: [id number] Date of Birth: October 24, 1945 Referring Provider: Corene Cornea Heggerick, Pa-C  Encounter Date: 08/02/2017      OT End of Session - 08/02/17 1442    Visit Number 14   Number of Visits 18   Date for OT Re-Evaluation 08/04/17   Authorization Type [employer] Insurance being billed; pt is bringing information   OT Start Time 1353   OT Stop Time 1434   OT Time Calculation (min) 41 min   Activity Tolerance Patient tolerated treatment well   Behavior During Therapy [facility] for tasks assessed/performed      Past Medical History:  Diagnosis Date  . Coronary artery disease 09/2009   stress test EF 67%  . Hypercholesteremia   . Hypertension   . Myocardial infarction [facility])     Past Surgical History:  Procedure Laterality Date  . BREAST CYST EXCISION     fibro cyst removal  . CORONARY ANGIOPLASTY WITH STENT PLACEMENT Right 07/2005   receiving two consecutive drug eluting 2.5x70mm Taxus stents in the proximal to mid right coronary artery  . TONSILLECTOMY      There were no vitals filed for this visit.      Subjective Assessment - 08/02/17 1353    Subjective  S: I'm trying to use this hand more.    Currently in Pain? No/denies            [facility] OT Assessment - 08/02/17 1353      Assessment   Diagnosis s/p ORIF for left distal radius and ulnar fracture     Precautions   Precautions None                  OT Treatments/Exercises (OP) - 08/02/17 1353      Exercises   Exercises Wrist;Hand     Weighted Stretch Over Towel Roll   Supination - Weighted Stretch 3 pounds;60 seconds   Wrist Extension - Weighted Stretch 3 pounds;60 seconds     Wrist Exercises   Forearm Supination PROM;AROM;10 reps   Forearm Supination Limitations with arm adducted to side     Forearm Pronation PROM;AROM;10 reps   Forearm Pronation Limitations with arm adducted to side   Wrist Flexion PROM;AROM;10 reps   Wrist Extension PROM;AROM;10 reps   Wrist Radial Deviation PROM;AROM;10 reps   Wrist Ulnar Deviation PROM;AROM;10 reps     Additional Wrist Exercises   Sponges in hand manipulation 10, 10 with min vg     Hand Exercises   MCPJ Flexion AROM;5 reps     Manual Therapy   Manual Therapy Myofascial release   Manual therapy comments completed separately from therapeutic exercises   Myofascial Release Myofascial release to left dorsal hand, wrist, dorsal and volar forearms to decrease pain and fascial restrictions and increase joint range of motion.                   OT Short Term Goals - 07/04/17 1406      OT SHORT TERM GOAL #1   Title Pt will be educated on HEP for improved use of LUE during B/IADL completion.    Time 3   Period Weeks   Status On-going     OT SHORT TERM GOAL #2   Title Pt will improve left hand/wrist P/ROM by 10 degrees to improve ability to  use LUE as assist during dressing tasks.    Time 3   Period Weeks   Status On-going           OT Long Term Goals - 07/04/17 1407      OT LONG TERM GOAL #1   Title Pt will improve A/ROM of left hand and wrist by 15 degrees to improve ability to use LUE as assist during bathing tasks.    Time 6   Period Weeks   Status On-going     OT LONG TERM GOAL #2   Title Pt will increase functional use of LUE by using left hand for ADL completion a minimum of 25% of the time.    Time 6   Period Weeks   Status On-going     OT LONG TERM GOAL #3   Title Pt will improve left grip strength by 5# and pinch strength by 2# to improve ability to grasp and maintain hold of lightweight items.    Time 6   Period Weeks   Status On-going     OT LONG TERM GOAL #4   Title Pt will improve left wrist strength to 3-/5 to improve ability to use LUE as assist during meal preparation tasks.    Time 6    Period Weeks   Status On-going               Plan - 08/02/17 1446    Clinical Impression Statement A: Pt able to pick up 10 sponges on 2 trials of in hand manipulation this date. Continued with supination exercises with shoulder adducted to side. Pt reports she is using her arm at home for ADLs as she is able. Verbal cuing for technique with exercises.    Plan P: Reassessment and recertification, continue to work on in hand manipulation and add pinch task   OT Home Exercise Plan 10/2: wrist/forearm A/ROM   Consulted and Agree with Plan of Care Patient      Patient will benefit from skilled therapeutic intervention in order to improve the following deficits and impairments:  Increased muscle spasms, Decreased scar mobility, Impaired flexibility, Decreased strength, Decreased activity tolerance, Decreased mobility, Pain, Increased edema, Impaired tone, Decreased range of motion, Decreased coordination, Increased fascial restricitons, Impaired UE functional use  Visit Diagnosis: Other symptoms and signs involving the musculoskeletal system  Stiffness of left wrist, not elsewhere classified  Stiffness of left hand, not elsewhere classified    Problem List Patient Active Problem List   Diagnosis Date Noted  . Left carotid bruit 03/15/2013  . Aortic valve stenosis, mild 03/15/2013  . Essential hypertension   . Hypercholesteremia   . Coronary artery disease 09/02/2009   [place], OTR/L  [phone] 08/02/2017, 2:51 PM  [place] [address] [place], [place], [address] Phone: [phone]   Fax:  [phone]  Name: Ann Patel MRN: [id number] Date of Birth: 29-Oct-1945

## 2017-08-04 ENCOUNTER — Encounter (HOSPITAL_COMMUNITY): Payer: Self-pay | Admitting: Occupational Therapy

## 2017-08-04 ENCOUNTER — Ambulatory Visit (HOSPITAL_COMMUNITY): Payer: Medicare HMO | Attending: Physician Assistant | Admitting: Occupational Therapy

## 2017-08-04 DIAGNOSIS — M25661 Stiffness of right knee, not elsewhere classified: Secondary | ICD-10-CM | POA: Diagnosis not present

## 2017-08-04 DIAGNOSIS — M25632 Stiffness of left wrist, not elsewhere classified: Secondary | ICD-10-CM | POA: Diagnosis not present

## 2017-08-04 DIAGNOSIS — M25642 Stiffness of left hand, not elsewhere classified: Secondary | ICD-10-CM | POA: Insufficient documentation

## 2017-08-04 DIAGNOSIS — R29898 Other symptoms and signs involving the musculoskeletal system: Secondary | ICD-10-CM | POA: Insufficient documentation

## 2017-08-04 NOTE — Therapy (Addendum)
[place], [place], [address] Phone: [phone]   Fax:  [phone]  Occupational Therapy Reassessment and Treatment (and recertification)  Patient Details  Name: Ann Patel MRN: [id number] Date of Birth: Feb 15, 1946 Referring Provider: Corene Cornea Heggerick, Pa-C  Encounter Date: 08/04/2017      OT End of Session - 08/04/17 1756    Visit Number 15   Number of Visits 23   Date for OT Re-Evaluation 09/03/17   Authorization Type Car Insurance being billed; pt is bringing information   OT Start Time 1430   OT Stop Time 1515   OT Time Calculation (min) 45 min   Activity Tolerance Patient tolerated treatment well   Behavior During Therapy [facility] for tasks assessed/performed      Past Medical History:  Diagnosis Date  . Coronary artery disease 09/2009   stress test EF 67%  . Hypercholesteremia   . Hypertension   . Myocardial infarction [facility])     Past Surgical History:  Procedure Laterality Date  . BREAST CYST EXCISION     fibro cyst removal  . CORONARY ANGIOPLASTY WITH STENT PLACEMENT Right 07/2005   receiving two consecutive drug eluting 2.5x66m Taxus stents in the proximal to mid right coronary artery  . TONSILLECTOMY      There were no vitals filed for this visit.      Subjective Assessment - 08/04/17 1431    Subjective  S: I've been opening jars.    Currently in Pain? No/denies            [facility] - [facility]31      Assessment   Diagnosis s/p ORIF for left distal radius and ulnar fracture     Precautions   Precautions None     AROM   Right/Left Forearm Left   Left Forearm Pronation 90 Degrees  same as previous   Left Forearm Supination 65 Degrees  4 previous   Right/Left Wrist Left   Left Wrist Extension 12 Degrees  6 previous   Left Wrist Flexion 49 Degrees  32 previous   Left Wrist Radial Deviation 10 Degrees  66 previous   Left Wrist Ulnar Deviation 12 Degrees   6 previous     PROM   Right/Left Forearm Left   Left Forearm Pronation 90 Degrees  same as previous   Left Forearm Supination 70 Degrees  4 previous   Right/Left Wrist Left   Left Wrist Extension 20 Degrees  10 previous   Left Wrist Flexion 57 Degrees  45 previous   Left Wrist Radial Deviation 12 Degrees  8 previous   Left Wrist Ulnar Deviation 15 Degrees  8 previous     Strength   Strength Assessment Site Wrist   Right/Left Wrist Left   Left Wrist Flexion 4/5  not tested previously   Left Wrist Extension 3+/5  not tested previously   Left Wrist Radial Deviation 4/5  not tested previously   Left Wrist Ulnar Deviation 4/5  not tested previously   Left Hand Gross Grasp Impaired   Left Hand Grip (lbs) 10  0 previous   Left Hand Lateral Pinch 7 lbs  3 previous   Left Hand 3 Point Pinch 7 lbs  3 previous     Left Hand AROM   L Thumb MCP 0-60 54 Degrees  42 previous   L Thumb IP 0-80 6 Degrees  8 previous   L Index  MCP 0-90 62 Degrees  same  as previous   L Index PIP 0-100 44 Degrees  40 previous   L Index DIP 0-70 12 Degrees  same as previous   L Long  MCP 0-90 65 Degrees  60 previous   L Long PIP 0-100 60 Degrees  50 previous   L Long DIP 0-70 12 Degrees  8 previous   L Ring  MCP 0-90 52 Degrees  46 previous   L Ring PIP 0-100 58 Degrees  48 previous   L Ring DIP 0-70 4 Degrees  10 previous   L Little  MCP 0-90 52 Degrees  same as previous   L Little PIP 0-100 58 Degrees  40 previous   L Little DIP 0-70 6 Degrees  same as previous                  OT Treatments/Exercises (OP) - 08/04/17 1431      Exercises   Exercises Wrist;Hand     Wrist Exercises   Forearm Supination PROM;10 reps   Forearm Pronation PROM;10 reps   Wrist Flexion PROM;10 reps   Wrist Extension PROM;10 reps   Wrist Radial Deviation PROM;10 reps   Wrist Ulnar Deviation PROM;10 reps     Manual Therapy   Manual Therapy Myofascial release   Manual therapy comments  completed separately from therapeutic exercises   Myofascial Release Myofascial release to left dorsal hand, wrist, dorsal and volar forearms to decrease pain and fascial restrictions and increase joint range of motion.                   OT Short Term Goals - 08/04/17 1758      OT SHORT TERM GOAL #1   Title Pt will be educated on HEP for improved use of LUE during B/IADL completion.    Time 3   Period Weeks   Status On-going     OT SHORT TERM GOAL #2   Title Pt will improve left hand/wrist P/ROM by 10 degrees to improve ability to use LUE as assist during dressing tasks.    Time 3   Period Weeks   Status Partially Met           OT Long Term Goals - 08/04/17 1758      OT LONG TERM GOAL #1   Title Pt will improve A/ROM of left hand and wrist by 15 degrees to improve ability to use LUE as assist during bathing tasks.    Time 6   Period Weeks   Status Partially Met     OT LONG TERM GOAL #2   Title Pt will increase functional use of LUE by using left hand for ADL completion a minimum of 25% of the time.    Time 6   Period Weeks   Status On-going     OT LONG TERM GOAL #3   Title Pt will improve left grip strength by 15# and pinch strength by 5# to improve ability to grasp and maintain hold of lightweight items.    Time 6   Period Weeks   Status Revised     OT LONG TERM GOAL #4   Title Pt will improve left wrist strength to 3-/5 to improve ability to use LUE as assist during meal preparation tasks.    Time 6   Period Weeks   Status On-going               Plan - 08/04/17 1757    Clinical Impression Statement A: Reassessment  completed this session, pt is making slow progress with wrist and forearm ROM as well as grip and pinch strength. Pt has partially met 1/2 STGs and has met 1 LTG and partially met an additional LTG. The achieved LTG has been revised. Pt will benefit from continued skilled therapy services to work on improving wrist, forearm, and hand  A/ROM and strength, as well as grip and pinch strength and functional use of LUE during ADL tasks.    Rehab Potential Good   OT Frequency 3x / week   OT Duration 4 weeks   OT Treatment/Interventions Self-care/ADL training;Therapeutic exercise;Patient/family education;Ultrasound;Neuromuscular education;Manual Therapy;Splinting;Parrafin;DME and/or AE instruction;Therapeutic activities;Electrical Stimulation;Moist Heat;Contrast Bath;Passive range of motion   Plan P: Continue with manual therapy, add pinch task   Clinical Decision Making Several treatment options, min-mod task modification necessary   OT Home Exercise Plan 10/2: wrist/forearm A/ROM   Consulted and Agree with Plan of Care Patient      Patient will benefit from skilled therapeutic intervention in order to improve the following deficits and impairments:  Increased muscle spasms, Decreased scar mobility, Impaired flexibility, Decreased strength, Decreased activity tolerance, Decreased mobility, Pain, Increased edema, Impaired tone, Decreased range of motion, Decreased coordination, Increased fascial restricitons, Impaired UE functional use  Visit Diagnosis: Other symptoms and signs involving the musculoskeletal system - Plan: Ot plan of care cert/re-cert  Stiffness of left wrist, not elsewhere classified - Plan: Ot plan of care cert/re-cert  Stiffness of left hand, not elsewhere classified - Plan: Ot plan of care cert/re-cert    Problem List Patient Active Problem List   Diagnosis Date Noted  . Left carotid bruit 03/15/2013  . Aortic valve stenosis, mild 03/15/2013  . Essential hypertension   . Hypercholesteremia   . Coronary artery disease 09/02/2009   Guadelupe Sabin, OTR/L  [phone] 08/04/2017, 6:06 PM  [place] [address] [place], [place], [address] Phone: [phone]   Fax:  [phone]  Name: Ann Patel MRN: [id number] Date of Birth: [birth year]

## 2017-08-07 ENCOUNTER — Ambulatory Visit: Payer: Commercial Managed Care - HMO | Admitting: Cardiovascular Disease

## 2017-08-07 ENCOUNTER — Encounter: Payer: Self-pay | Admitting: Cardiovascular Disease

## 2017-08-07 VITALS — BP 136/58 | HR 73 | Ht 59.0 in | Wt 111.0 lb

## 2017-08-07 DIAGNOSIS — E785 Hyperlipidemia, unspecified: Secondary | ICD-10-CM

## 2017-08-07 DIAGNOSIS — Z79899 Other long term (current) drug therapy: Secondary | ICD-10-CM | POA: Diagnosis not present

## 2017-08-07 DIAGNOSIS — I10 Essential (primary) hypertension: Secondary | ICD-10-CM | POA: Diagnosis not present

## 2017-08-07 DIAGNOSIS — I2510 Atherosclerotic heart disease of native coronary artery without angina pectoris: Secondary | ICD-10-CM

## 2017-08-07 MED ORDER — ATORVASTATIN CALCIUM 20 MG PO TABS: 20.0000 mg | ORAL_TABLET | Freq: Every day | ORAL | 3 refills | Status: DC

## 2017-08-07 MED ORDER — CLOPIDOGREL BISULFATE 75 MG PO TABS: 75.0000 mg | ORAL_TABLET | Freq: Every day | ORAL | 3 refills | Status: DC

## 2017-08-07 MED ORDER — LISINOPRIL 20 MG PO TABS: 20.0000 mg | ORAL_TABLET | Freq: Every day | ORAL | 3 refills | Status: DC

## 2017-08-07 MED ORDER — AMLODIPINE BESYLATE 10 MG PO TABS: 10.0000 mg | ORAL_TABLET | Freq: Every day | ORAL | 3 refills | Status: DC

## 2017-08-07 NOTE — Patient Instructions (Signed)
Dr Sallyanne Kuster recommends that you schedule a follow-up appointment in 12 months. You will receive a reminder letter in the mail two months in advance. If you don't receive a letter, please call our office to schedule the follow-up appointment.  If you need a refill on your cardiac medications before your next appointment, please call your pharmacy.  Your physician recommends that you return for lab work in at your earliest [place].
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
## 2017-08-09 ENCOUNTER — Ambulatory Visit (HOSPITAL_COMMUNITY): Payer: Medicare HMO | Admitting: Occupational Therapy

## 2017-08-09 ENCOUNTER — Encounter (HOSPITAL_COMMUNITY): Payer: Self-pay | Admitting: Occupational Therapy

## 2017-08-09 DIAGNOSIS — E782 Mixed hyperlipidemia: Secondary | ICD-10-CM | POA: Diagnosis not present

## 2017-08-09 DIAGNOSIS — M25642 Stiffness of left hand, not elsewhere classified: Secondary | ICD-10-CM

## 2017-08-09 DIAGNOSIS — M25632 Stiffness of left wrist, not elsewhere classified: Secondary | ICD-10-CM

## 2017-08-09 DIAGNOSIS — E785 Hyperlipidemia, unspecified: Secondary | ICD-10-CM | POA: Diagnosis not present

## 2017-08-09 DIAGNOSIS — R29898 Other symptoms and signs involving the musculoskeletal system: Secondary | ICD-10-CM

## 2017-08-09 DIAGNOSIS — M25661 Stiffness of right knee, not elsewhere classified: Secondary | ICD-10-CM | POA: Diagnosis not present

## 2017-08-09 DIAGNOSIS — Z79899 Other long term (current) drug therapy: Secondary | ICD-10-CM | POA: Diagnosis not present

## 2017-08-09 NOTE — Therapy (Signed)
[place], [place], [address] Phone: [phone]   Fax:  [phone]  Occupational Therapy Treatment  Patient Details  Name: EMILIANA BLAIZE MRN: [id number] Date of Birth: 17-Jul-1946 Referring Provider: Corene Cornea Heggerick, Pa-C   Encounter Date: 08/09/2017  OT End of Session - 08/09/17 1032    Visit Number  16    Number of Visits  23    Date for OT Re-Evaluation  09/03/17    Authorization Type  Car Insurance being billed; pt is bringing information    OT Start Time  [phone]    OT Stop Time  1030    OT Time Calculation (min)  42 min    Activity Tolerance  Patient tolerated treatment well    Behavior During Therapy  [facility] for tasks assessed/performed       Past Medical History:  Diagnosis Date  . Coronary artery disease 09/2009   stress test EF 67%  . Hypercholesteremia   . Hypertension   . Myocardial infarction [facility])     Past Surgical History:  Procedure Laterality Date  . BREAST CYST EXCISION     fibro cyst removal  . CORONARY ANGIOPLASTY WITH STENT PLACEMENT Right 07/2005   receiving two consecutive drug eluting 2.5x72m Taxus stents in the proximal to mid right coronary artery  . TONSILLECTOMY      There were no vitals filed for this visit.  Subjective Assessment - 08/09/17 0948    Subjective   S: I've been trying to use it more.     Currently in Pain?  No/denies         [facility] - [facility]      Assessment   Diagnosis  s/p ORIF for left distal radius and ulnar fracture      Precautions   Precautions  None               OT Treatments/Exercises (OP) - 08/09/17 0949      Exercises   Exercises  Wrist;Hand      Weighted Stretch Over Towel Roll   Supination - Weighted Stretch  3 pounds;60 seconds      Wrist Exercises   Forearm Supination  PROM;AROM;10 reps    Forearm Pronation  PROM;AROM;10 reps    Wrist Flexion  PROM;AROM;10 reps    Wrist Extension  PROM;AROM;10  reps    Wrist Radial Deviation  PROM;10 reps    Wrist Ulnar Deviation  PROM;10 reps      Additional Wrist Exercises   Hand Gripper with Large Beads  all beads gripper set at 7# (sideways hold)    Hand Gripper with Medium Beads  all beads gripper set at 7# (sideways hold)      Hand Exercises   Joint Blocking Exercises  A/ROM PIP joints, 10X each      Manual Therapy   Manual Therapy  Myofascial release    Manual therapy comments  completed separately from therapeutic exercises    Myofascial Release  Myofascial release to left dorsal hand, wrist, dorsal and volar forearms to decrease pain and fascial restrictions and increase joint range of motion.                OT Short Term Goals - 08/04/17 1758      OT SHORT TERM GOAL #1   Title  Pt will be educated on HEP for improved use of LUE during B/IADL completion.     Time  3  Period  Weeks    Status  On-going      OT SHORT TERM GOAL #2   Title  Pt will improve left hand/wrist P/ROM by 10 degrees to improve ability to use LUE as assist during dressing tasks.     Time  3    Period  Weeks    Status  Partially Met        OT Long Term Goals - 08/04/17 1758      OT LONG TERM GOAL #1   Title  Pt will improve A/ROM of left hand and wrist by 15 degrees to improve ability to use LUE as assist during bathing tasks.     Time  6    Period  Weeks    Status  Partially Met      OT LONG TERM GOAL #2   Title  Pt will increase functional use of LUE by using left hand for ADL completion a minimum of 25% of the time.     Time  6    Period  Weeks    Status  On-going      OT LONG TERM GOAL #3   Title  Pt will improve left grip strength by 15# and pinch strength by 5# to improve ability to grasp and maintain hold of lightweight items.     Time  6    Period  Weeks    Status  Revised      OT LONG TERM GOAL #4   Title  Pt will improve left wrist strength to 3-/5 to improve ability to use LUE as assist during meal preparation tasks.      Time  6    Period  Weeks    Status  On-going            Plan - 08/09/17 1032    Clinical Impression Statement  A: Continued with passive stretching and manual therapy, resumed joint blocking for PIP joints. Added hand gripper activity today, pt held sideways due to radial deviation limitations when attempting to hold upright. Pt reports she is attempting to use the left hand during ADLs as much as possible, even if it takes her longer to complete tasks.     Plan  P: Add pinch task, continue working on digits and joint blocking    OT Home Exercise Plan  10/2: wrist/forearm A/ROM       Patient will benefit from skilled therapeutic intervention in order to improve the following deficits and impairments:  Increased muscle spasms, Decreased scar mobility, Impaired flexibility, Decreased strength, Decreased activity tolerance, Decreased mobility, Pain, Increased edema, Impaired tone, Decreased range of motion, Decreased coordination, Increased fascial restricitons, Impaired UE functional use  Visit Diagnosis: Other symptoms and signs involving the musculoskeletal system  Stiffness of left wrist, not elsewhere classified  Stiffness of left hand, not elsewhere classified    Problem List Patient Active Problem List   Diagnosis Date Noted  . Left carotid bruit 03/15/2013  . Aortic valve stenosis, mild 03/15/2013  . Essential hypertension   . Hypercholesteremia   . Coronary artery disease 09/02/2009   Guadelupe Sabin, OTR/L  [phone] 08/09/2017, 10:34 AM  [place] [address] [place], [place], [address] Phone: [phone]   Fax:  [phone]  Name: SYBIL SHRADER MRN: [id number] Date of Birth: 1945/11/24

## 2017-08-10 LAB — COMPREHENSIVE METABOLIC PANEL
A/G RATIO: 1.8 (ref 1.2–2.2)
ALT: 8 IU/L (ref 0–32)
AST: 15 IU/L (ref 0–40)
Albumin: 4.4 g/dL (ref 3.5–4.8)
Alkaline Phosphatase: 81 IU/L (ref 39–117)
BUN/Creatinine Ratio: 10 — ABNORMAL LOW (ref 12–28)
BUN: 7 mg/dL — AB (ref 8–27)
Bilirubin Total: 0.4 mg/dL (ref 0.0–1.2)
CALCIUM: 9.7 mg/dL (ref 8.7–10.3)
CO2: 26 mmol/L (ref 20–29)
CREATININE: 0.67 mg/dL (ref 0.57–1.00)
Chloride: 106 mmol/L (ref 96–106)
GFR, EST AFRICAN AMERICAN: 102 mL/min/{1.73_m2} (ref >59)
GFR, EST NON AFRICAN AMERICAN: 89 mL/min/{1.73_m2} (ref >59)
GLUCOSE: 91 mg/dL (ref 65–99)
Globulin, Total: 2.4 g/dL (ref 1.5–4.5)
POTASSIUM: 3.8 mmol/L (ref 3.5–5.2)
Sodium: 144 mmol/L (ref 134–144)
TOTAL PROTEIN: 6.8 g/dL (ref 6.0–8.5)

## 2017-08-10 LAB — LIPID PANEL
CHOL/HDL RATIO: 2.8 ratio (ref 0.0–4.4)
Cholesterol, Total: 162 mg/dL (ref 100–199)
HDL: 57 mg/dL (ref >39)
LDL Calculated: 88 mg/dL (ref 0–99)
TRIGLYCERIDES: 83 mg/dL (ref 0–149)
VLDL CHOLESTEROL CAL: 17 mg/dL (ref 5–40)

## 2017-08-11 ENCOUNTER — Ambulatory Visit (HOSPITAL_COMMUNITY): Payer: Medicare HMO | Admitting: Occupational Therapy

## 2017-08-11 ENCOUNTER — Telehealth (HOSPITAL_COMMUNITY): Payer: Self-pay

## 2017-08-11 ENCOUNTER — Encounter (HOSPITAL_COMMUNITY): Payer: Self-pay | Admitting: Occupational Therapy

## 2017-08-11 DIAGNOSIS — M25642 Stiffness of left hand, not elsewhere classified: Secondary | ICD-10-CM

## 2017-08-11 DIAGNOSIS — R29898 Other symptoms and signs involving the musculoskeletal system: Secondary | ICD-10-CM | POA: Diagnosis not present

## 2017-08-11 DIAGNOSIS — M25661 Stiffness of right knee, not elsewhere classified: Secondary | ICD-10-CM | POA: Diagnosis not present

## 2017-08-11 DIAGNOSIS — M25632 Stiffness of left wrist, not elsewhere classified: Secondary | ICD-10-CM | POA: Diagnosis not present

## 2017-08-11 NOTE — Telephone Encounter (Signed)
PT spoke with Coral[employer] Customer Service Representative, regarding the pt's JAS brace as pt has yet to receive it. Apolonio Schneiders stated that they had been trying to contact Quynn for a while now (10/22, 10/24, 10/29, 11/1, 11/9), however, pt did not answer and neither of her phones had voicemail setup so they could not leave her a message. PT then called the pt at home and spoke directly to her. PT provided pt with Rachel's number and extension and told the pt to call Apolonio Schneiders as soon as possible as she was waiting to hear from her. Pt verbalized understanding and stated she would call.    Geraldine Solar PT, DPT

## 2017-08-11 NOTE — Patient Instructions (Signed)
1) Digit composite flexion/adduction (make a fist) Hold your hand up as shown. Open and close your hand into a fist and repeat. If you cannot make a full fist, then make a partial fist.    2) Thumb/finger opposition Touch the tip of the thumb to each fingertip one by one. Extend fingers fully after they are touched.        3) PIP Joint Blocking Grasp the affected finger, bracing below the middle knuckle, and actively bend the finger as shown.    4) DIP Joint Blocking Grasp the affected finger, bracing below the last knuckle, and actively bend the finger at the last joint.     5) Digit Abduction/Adduction Hold hand palm down flat on table. Spread your fingers apart and back together.

## 2017-08-11 NOTE — Therapy (Addendum)
[place], [place], [address] Phone: [phone]   Fax:  [phone]  Occupational Therapy Treatment  Patient Details  Name: Ann Patel MRN: [id number] Date of Birth: 10-23-1945 Referring Provider: Corene Cornea Heggerick, Pa-C   Encounter Date: 08/11/2017  OT End of Session - 08/11/17 1212    Visit Number  17    Number of Visits  23    Date for OT Re-Evaluation  09/03/17    Authorization Type  Car Insurance being billed; pt is bringing information    OT Start Time  1030    OT Stop Time  1115    OT Time Calculation (min)  45 min    Activity Tolerance  Patient tolerated treatment well    Behavior During Therapy  [facility] for tasks assessed/performed       Past Medical History:  Diagnosis Date  . Coronary artery disease 09/2009   stress test EF 67%  . Hypercholesteremia   . Hypertension   . Myocardial infarction [facility])     Past Surgical History:  Procedure Laterality Date  . BREAST CYST EXCISION     fibro cyst removal  . CORONARY ANGIOPLASTY WITH STENT PLACEMENT Right 07/2005   receiving two consecutive drug eluting 2.5x38m Taxus stents in the proximal to mid right coronary artery  . TONSILLECTOMY      There were no vitals filed for this visit.  Subjective Assessment - 08/11/17 1023    Subjective   S: It's a little sore when I leave but it doesn't last long.     Currently in Pain?  No/denies         [facility] - [facility]      Assessment   Diagnosis  s/p ORIF for left distal radius and ulnar fracture      Precautions   Precautions  None               OT Treatments/Exercises (OP) - 08/11/17 1023      Exercises   Exercises  Wrist;Hand      Weighted Stretch Over Towel Roll   Supination - Weighted Stretch  3 pounds;60 seconds      Wrist Exercises   Forearm Supination  PROM;5 reps    Forearm Pronation  PROM;5 reps    Wrist Flexion  PROM;5 reps    Wrist Extension  PROM;5  reps      Additional Wrist Exercises   Sponges  in hand manipulation 11; pt then used yellow and red clothespin to grasp and place 20 sponges into bucket using 3 point pinch, verbal cuing to keep elbow by side.       Hand Exercises   MCPJ Flexion  PROM;5 reps    MCPJ Extension  PROM;5 reps sustained stretch    Joint Blocking Exercises  P/ROM and A/ROM all digits and joints, 10X each    Opposition  PROM;AROM;5 reps      Manual Therapy   Manual Therapy  Myofascial release    Manual therapy comments  completed separately from therapeutic exercises    Myofascial Release  Myofascial release to left dorsal hand and wrist and myofascial hand spreading to decrease pain and fascial restrictions and increase joint range of motion.              OT Education - 08/11/17 1217    Education provided  Yes    Education Details  joint blocking    Person(s) Educated  Patient  Methods  Explanation;Demonstration    Comprehension  Verbalized understanding;Returned demonstration       OT Short Term Goals - 08/04/17 1758      OT SHORT TERM GOAL #1   Title  Pt will be educated on HEP for improved use of LUE during B/IADL completion.     Time  3    Period  Weeks    Status  On-going      OT SHORT TERM GOAL #2   Title  Pt will improve left hand/wrist P/ROM by 10 degrees to improve ability to use LUE as assist during dressing tasks.     Time  3    Period  Weeks    Status  Partially Met        OT Long Term Goals - 08/04/17 1758      OT LONG TERM GOAL #1   Title  Pt will improve A/ROM of left hand and wrist by 15 degrees to improve ability to use LUE as assist during bathing tasks.     Time  6    Period  Weeks    Status  Partially Met      OT LONG TERM GOAL #2   Title  Pt will increase functional use of LUE by using left hand for ADL completion a minimum of 25% of the time.     Time  6    Period  Weeks    Status  On-going      OT LONG TERM GOAL #3   Title  Pt will improve left grip  strength by 15# and pinch strength by 5# to improve ability to grasp and maintain hold of lightweight items.     Time  6    Period  Weeks    Status  Revised      OT LONG TERM GOAL #4   Title  Pt will improve left wrist strength to 3-/5 to improve ability to use LUE as assist during meal preparation tasks.     Time  6    Period  Weeks    Status  On-going            Plan - 08/11/17 1212    Clinical Impression Statement  A: Session focusing on digit and hand mobilization today, manual therapy completed to dorsal and volar hand to improve soft tissue and joint mobility. Added pinch task, pt completing wihtout difficulty verbal cuing for form keeping elbow adducted to side.     Plan  P: Continue working on improving mobility of digits and hand mobilization, follow up on joint blocking HEP.     OT Home Exercise Plan  10/2: wrist/forearm A/ROM; 11/9: joint blocking    Consulted and Agree with Plan of Care  Patient       Patient will benefit from skilled therapeutic intervention in order to improve the following deficits and impairments:  Increased muscle spasms, Decreased scar mobility, Impaired flexibility, Decreased strength, Decreased activity tolerance, Decreased mobility, Pain, Increased edema, Impaired tone, Decreased range of motion, Decreased coordination, Increased fascial restricitons, Impaired UE functional use  Visit Diagnosis: Other symptoms and signs involving the musculoskeletal system  Stiffness of left wrist, not elsewhere classified  Stiffness of left hand, not elsewhere classified    Problem List Patient Active Problem List   Diagnosis Date Noted  . Left carotid bruit 03/15/2013  . Aortic valve stenosis, mild 03/15/2013  . Essential hypertension   . Hypercholesteremia   . Coronary artery disease 09/02/2009  Guadelupe Patel, OTR/L  [phone] 08/11/2017, 5:44 PM  [facility] [address] [place], [place],  [address] Phone: [phone]   Fax:  [phone]  Name: Ann Patel MRN: [id number] Date of Birth: 1946-05-18

## 2017-08-14 ENCOUNTER — Encounter (HOSPITAL_COMMUNITY): Payer: Self-pay | Admitting: Specialist

## 2017-08-14 ENCOUNTER — Ambulatory Visit (HOSPITAL_COMMUNITY): Payer: Medicare HMO | Admitting: Specialist

## 2017-08-14 DIAGNOSIS — M25661 Stiffness of right knee, not elsewhere classified: Secondary | ICD-10-CM | POA: Diagnosis not present

## 2017-08-14 DIAGNOSIS — M25632 Stiffness of left wrist, not elsewhere classified: Secondary | ICD-10-CM

## 2017-08-14 DIAGNOSIS — M25642 Stiffness of left hand, not elsewhere classified: Secondary | ICD-10-CM

## 2017-08-14 DIAGNOSIS — R29898 Other symptoms and signs involving the musculoskeletal system: Secondary | ICD-10-CM | POA: Diagnosis not present

## 2017-08-14 NOTE — Therapy (Signed)
[place], [place], [address] Phone: [phone]   Fax:  [phone]  Occupational Therapy Treatment  Patient Details  Name: Ann Patel MRN: [id number] Date of Birth: 1946/01/28 Referring Provider: Corene Cornea Heggerick, Pa-C   Encounter Date: 08/14/2017  OT End of Session - 08/14/17 1252    Visit Number  18    Number of Visits  23    Date for OT Re-Evaluation  09/03/17    Authorization Type  Car Insurance being billed; pt is bringing information    OT Start Time  1120    OT Stop Time  1200    OT Time Calculation (min)  40 min    Activity Tolerance  Patient tolerated treatment well    Behavior During Therapy  [facility] for tasks assessed/performed       Past Medical History:  Diagnosis Date  . Coronary artery disease 09/2009   stress test EF 67%  . Hypercholesteremia   . Hypertension   . Myocardial infarction [facility])     Past Surgical History:  Procedure Laterality Date  . BREAST CYST EXCISION     fibro cyst removal  . CORONARY ANGIOPLASTY WITH STENT PLACEMENT Right 07/2005   receiving two consecutive drug eluting 2.5x48m Taxus stents in the proximal to mid right coronary artery  . TONSILLECTOMY      There were no vitals filed for this visit.  Subjective Assessment - 08/14/17 1251    Subjective   S:  I wore a brace on my hand yesterday and really worked it.     Currently in Pain?  No/denies         [facility] - 08/14/17 0001      Assessment   Diagnosis  s/p ORIF for left distal radius and ulnar fracture      Precautions   Precautions  None               OT Treatments/Exercises (OP) - 08/14/17 0001      Exercises   Exercises  Wrist;Hand      Additional Wrist Exercises   Sponges  in hand manipulation 13, 13, 15    Theraputty - Roll  red    Theraputty - Grip  red       Hand Exercises   MCPJ Extension  AROM;5 reps    Joint Blocking Exercises  P/ROM and A/ROM all digits  and joints, 10X each    Digit Composite ABduction  AROM;5 reps    Opposition  PROM;AROM;5 reps      Manual Therapy   Manual Therapy  Myofascial release    Manual therapy comments  completed separately from therapeutic exercises    Myofascial Release  Myofascial release to left dorsal hand and wrist and myofascial hand spreading to decrease pain and fascial restrictions and increase joint range of motion.                OT Short Term Goals - 08/04/17 1758      OT SHORT TERM GOAL #1   Title  Pt will be educated on HEP for improved use of LUE during B/IADL completion.     Time  3    Period  Weeks    Status  On-going      OT SHORT TERM GOAL #2   Title  Pt will improve left hand/wrist P/ROM by 10 degrees to improve ability to use LUE as assist during dressing tasks.  Time  3    Period  Weeks    Status  Partially Met        OT Long Term Goals - 08/04/17 1758      OT LONG TERM GOAL #1   Title  Pt will improve A/ROM of left hand and wrist by 15 degrees to improve ability to use LUE as assist during bathing tasks.     Time  6    Period  Weeks    Status  Partially Met      OT LONG TERM GOAL #2   Title  Pt will increase functional use of LUE by using left hand for ADL completion a minimum of 25% of the time.     Time  6    Period  Weeks    Status  On-going      OT LONG TERM GOAL #3   Title  Pt will improve left grip strength by 15# and pinch strength by 5# to improve ability to grasp and maintain hold of lightweight items.     Time  6    Period  Weeks    Status  Revised      OT LONG TERM GOAL #4   Title  Pt will improve left wrist strength to 3-/5 to improve ability to use LUE as assist during meal preparation tasks.     Time  6    Period  Weeks    Status  On-going            Plan - 08/14/17 1254    Clinical Impression Statement  A:  Focused session on A/ROM and mobility of digits and hand this date.  Patient able to pick up and hold 15 sponges this date,  an improvement from previous 13.    Plan  P:  Continue focus on hand mobility.  Improve mobility at isolated joints.        Patient will benefit from skilled therapeutic intervention in order to improve the following deficits and impairments:     Visit Diagnosis: Other symptoms and signs involving the musculoskeletal system  Stiffness of left wrist, not elsewhere classified  Stiffness of left hand, not elsewhere classified    Problem List Patient Active Problem List   Diagnosis Date Noted  . Left carotid bruit 03/15/2013  . Aortic valve stenosis, mild 03/15/2013  . Essential hypertension   . Hypercholesteremia   . Coronary artery disease 09/02/2009    Vangie Bicker, Briar, OTR/L [phone]  08/14/2017, 1:01 PM  [facility] [address] [place], [place], [address] Phone: [phone]   Fax:  [phone]  Name: Ann Patel MRN: [id number] Date of Birth: 1946/09/15

## 2017-08-16 ENCOUNTER — Ambulatory Visit (HOSPITAL_COMMUNITY): Payer: Medicare HMO | Admitting: Occupational Therapy

## 2017-08-16 ENCOUNTER — Other Ambulatory Visit: Payer: Self-pay

## 2017-08-16 ENCOUNTER — Encounter (HOSPITAL_COMMUNITY): Payer: Self-pay | Admitting: Occupational Therapy

## 2017-08-16 DIAGNOSIS — M25642 Stiffness of left hand, not elsewhere classified: Secondary | ICD-10-CM

## 2017-08-16 DIAGNOSIS — M25632 Stiffness of left wrist, not elsewhere classified: Secondary | ICD-10-CM | POA: Diagnosis not present

## 2017-08-16 DIAGNOSIS — M25661 Stiffness of right knee, not elsewhere classified: Secondary | ICD-10-CM | POA: Diagnosis not present

## 2017-08-16 DIAGNOSIS — R29898 Other symptoms and signs involving the musculoskeletal system: Secondary | ICD-10-CM | POA: Diagnosis not present

## 2017-08-16 NOTE — Therapy (Signed)
[place], [place], [address] Phone: [phone]   Fax:  [phone]  Occupational Therapy Treatment  Patient Details  Name: Ann Patel MRN: [id number] Date of Birth: Feb 10, 1946 Referring Provider: Corene Cornea Heggerick, Pa-C   Encounter Date: 08/16/2017  OT End of Session - 08/16/17 1041    Visit Number  19    Number of Visits  23    Date for OT Re-Evaluation  09/03/17    Authorization Type  Car Insurance being billed; pt is bringing information    OT Start Time  0945    OT Stop Time  1034    OT Time Calculation (min)  49 min    Activity Tolerance  Patient tolerated treatment well    Behavior During Therapy  [facility] for tasks assessed/performed       Past Medical History:  Diagnosis Date  . Coronary artery disease 09/2009   stress test EF 67%  . Hypercholesteremia   . Hypertension   . Myocardial infarction [facility])     Past Surgical History:  Procedure Laterality Date  . BREAST CYST EXCISION     fibro cyst removal  . CORONARY ANGIOPLASTY WITH STENT PLACEMENT Right 07/2005   receiving two consecutive drug eluting 2.5x73m Taxus stents in the proximal to mid right coronary artery  . TONSILLECTOMY      There were no vitals filed for this visit.  Subjective Assessment - 08/16/17 0945    Subjective   S: My hand has been sore after I mop or sweep.     Currently in Pain?  No/denies         [facility] - 08/16[facility]17 0944      Assessment   Diagnosis  s/p ORIF for left distal radius and ulnar fracture      Precautions   Precautions  None               OT Treatments/Exercises (OP) - 08/16/17 1011      Exercises   Exercises  Wrist;Hand      Wrist Exercises   Forearm Supination  PROM;5 reps    Forearm Pronation  PROM;5 reps    Wrist Flexion  PROM;5 reps    Wrist Extension  PROM;5 reps      Additional Wrist Exercises   Hand Gripper with Large Beads  all beads gripper set at 7#  (sideways hold)    Hand Gripper with Medium Beads  all beads gripper set at 7# (sideways hold)      Hand Exercises   MCPJ Flexion  PROM;5 reps    MCPJ Extension  PROM;AROM;5 reps    Joint Blocking Exercises  P/ROM and A/ROM all digits and joints, 10X each    Opposition  PROM;5 reps      Manual Therapy   Manual Therapy  Myofascial release    Manual therapy comments  completed separately from therapeutic exercises    Myofascial Release  Myofascial release to left dorsal hand and wrist and myofascial hand spreading to decrease pain and fascial restrictions and increase joint range of motion.              OT Education - 08/16/17 1041    Education provided  Yes    Education Details  educated on flexion glove wear including duration and frequency, and exercises to perform following wear    Person(s) Educated  Patient    Methods  Explanation;Demonstration;Handout    Comprehension  Verbalized understanding;Returned demonstration  OT Short Term Goals - 08/04/17 1758      OT SHORT TERM GOAL #1   Title  Pt will be educated on HEP for improved use of LUE during B/IADL completion.     Time  3    Period  Weeks    Status  On-going      OT SHORT TERM GOAL #2   Title  Pt will improve left hand/wrist P/ROM by 10 degrees to improve ability to use LUE as assist during dressing tasks.     Time  3    Period  Weeks    Status  Partially Met        OT Long Term Goals - 08/04/17 1758      OT LONG TERM GOAL #1   Title  Pt will improve A/ROM of left hand and wrist by 15 degrees to improve ability to use LUE as assist during bathing tasks.     Time  6    Period  Weeks    Status  Partially Met      OT LONG TERM GOAL #2   Title  Pt will increase functional use of LUE by using left hand for ADL completion a minimum of 25% of the time.     Time  6    Period  Weeks    Status  On-going      OT LONG TERM GOAL #3   Title  Pt will improve left grip strength by 15# and pinch strength by  5# to improve ability to grasp and maintain hold of lightweight items.     Time  6    Period  Weeks    Status  Revised      OT LONG TERM GOAL #4   Title  Pt will improve left wrist strength to 3-/5 to improve ability to use LUE as assist during meal preparation tasks.     Time  6    Period  Weeks    Status  On-going            Plan - 08/16/17 1041    Clinical Impression Statement  A: Session focusing on mobility of digits and hand, pt with slight increase in intrinsic muscle mobility. Resumed gripper activity for grip/grasp. Provided pt with flexion glove and pt demonstrates understanding to donn glove. Educated pt on importance of performing ROM exercises immediately after glove use to prevent loss of current extension.     Plan  P: Follow up on flexion glove, continue working to improve hand mobility     OT Home Exercise Plan  10/2: wrist/forearm A/ROM; 11/9: joint blocking; 11/14: flexion glove    Consulted and Agree with Plan of Care  Patient       Patient will benefit from skilled therapeutic intervention in order to improve the following deficits and impairments:  Increased muscle spasms, Decreased scar mobility, Impaired flexibility, Decreased strength, Decreased activity tolerance, Decreased mobility, Pain, Increased edema, Impaired tone, Decreased range of motion, Decreased coordination, Increased fascial restricitons, Impaired UE functional use  Visit Diagnosis: Other symptoms and signs involving the musculoskeletal system  Stiffness of left wrist, not elsewhere classified  Stiffness of left hand, not elsewhere classified    Problem List Patient Active Problem List   Diagnosis Date Noted  . Left carotid bruit 03/15/2013  . Aortic valve stenosis, mild 03/15/2013  . Essential hypertension   . Hypercholesteremia   . Coronary artery disease 09/02/2009   Guadelupe Sabin, OTR/L  [phone] 08/16/2017, 10:44  [facility] [address] [place], [place], [address] Phone: [phone]   Fax:  [phone]  Name: Ann Patel MRN: [id number] Date of Birth: October 19, 1945

## 2017-08-16 NOTE — Patient Instructions (Signed)
Flexion Glove Instructions:   Wear glove 3 times per day, working up to 30 minutes at a time.   Immediate after wearing perform the following exercises 10X each.   1) Digit composite flexion/adduction (make a fist) Hold your hand up as shown. Open and close your hand into a fist and repeat. If you cannot make a full fist, then make a partial fist.    2) PIP Joint Blocking Grasp the affected finger, bracing below the middle knuckle, and actively bend the finger as shown.    3) DIP Joint Blocking Grasp the affected finger, bracing below the last knuckle, and actively bend the finger at the last joint.       4) AROM: Finger Flexion / Extension   Actively bend fingers of right hand. Start with knuckles furthest from palm, and slowly make a fist.

## 2017-08-18 ENCOUNTER — Ambulatory Visit (HOSPITAL_COMMUNITY): Payer: Medicare HMO | Admitting: Occupational Therapy

## 2017-08-18 ENCOUNTER — Other Ambulatory Visit: Payer: Self-pay

## 2017-08-18 ENCOUNTER — Encounter (HOSPITAL_COMMUNITY): Payer: Self-pay | Admitting: Occupational Therapy

## 2017-08-18 DIAGNOSIS — M25632 Stiffness of left wrist, not elsewhere classified: Secondary | ICD-10-CM | POA: Diagnosis not present

## 2017-08-18 DIAGNOSIS — M25661 Stiffness of right knee, not elsewhere classified: Secondary | ICD-10-CM | POA: Diagnosis not present

## 2017-08-18 DIAGNOSIS — M25642 Stiffness of left hand, not elsewhere classified: Secondary | ICD-10-CM | POA: Diagnosis not present

## 2017-08-18 DIAGNOSIS — R29898 Other symptoms and signs involving the musculoskeletal system: Secondary | ICD-10-CM | POA: Diagnosis not present

## 2017-08-18 NOTE — Therapy (Signed)
[place], [place], [address] Phone: [phone]   Fax:  [phone]  Occupational Therapy Treatment  Patient Details  Name: Ann Patel MRN: [id number] Date of Birth: [birth year] Referring Provider: Corene Cornea Heggerick, Pa-C   Encounter Date: 08/18/2017  OT End of Session - 08/18/17 1720    Visit Number  20    Number of Visits  23    Date for OT Re-Evaluation  09/03/17    Authorization Type  Car Insurance being billed; pt is bringing information    OT Start Time  1605    OT Stop Time  1648    OT Time Calculation (min)  43 min    Activity Tolerance  Patient tolerated treatment well    Behavior During Therapy  [facility] for tasks assessed/performed       Past Medical History:  Diagnosis Date  . Coronary artery disease 09/2009   stress test EF 67%  . Hypercholesteremia   . Hypertension   . Myocardial infarction [facility])     Past Surgical History:  Procedure Laterality Date  . BREAST CYST EXCISION     fibro cyst removal  . CORONARY ANGIOPLASTY WITH STENT PLACEMENT Right 07/2005   receiving two consecutive drug eluting 2.5x36m Taxus stents in the proximal to mid right coronary artery  . TONSILLECTOMY      There were no vitals filed for this visit.  Subjective Assessment - 08/18/17 1607    Subjective   S: I've been wearing that glove but it hurts.     Currently in Pain?  No/denies         [facility] - [facility]17 160[facility]      Assessment   Diagnosis  s/p ORIF for left distal radius and ulnar fracture      Precautions   Precautions  None               OT Treatments/Exercises (OP) - 08/18/17 1607      Exercises   Exercises  Wrist;Hand      Wrist Exercises   Forearm Supination  PROM;5 reps    Forearm Pronation  PROM;5 reps    Wrist Flexion  PROM;5 reps;AROM;10 reps    Wrist Extension  PROM;5 reps;AROM;10 reps      Additional Wrist Exercises   Sponges  in hand manipulation 13, 14       Hand Exercises   MCPJ Flexion  PROM;5 reps    MCPJ Extension  PROM;AROM;5 reps    Opposition  PROM;5 reps    Digiticizer  3#, 10X 5th digit unable to reach       Manual Therapy   Manual Therapy  Myofascial release    Manual therapy comments  completed separately from therapeutic exercises    Myofascial Release  Myofascial release to left dorsal hand and wrist and myofascial hand spreading to decrease pain and fascial restrictions and increase joint range of motion.                OT Short Term Goals - 08/04/17 1758      OT SHORT TERM GOAL #1   Title  Pt will be educated on HEP for improved use of LUE during B/IADL completion.     Time  3    Period  Weeks    Status  On-going      OT SHORT TERM GOAL #2   Title  Pt will improve left hand/wrist P/ROM by 10 degrees to improve  ability to use LUE as assist during dressing tasks.     Time  3    Period  Weeks    Status  Partially Met        OT Long Term Goals - 08/04/17 1758      OT LONG TERM GOAL #1   Title  Pt will improve A/ROM of left hand and wrist by 15 degrees to improve ability to use LUE as assist during bathing tasks.     Time  6    Period  Weeks    Status  Partially Met      OT LONG TERM GOAL #2   Title  Pt will increase functional use of LUE by using left hand for ADL completion a minimum of 25% of the time.     Time  6    Period  Weeks    Status  On-going      OT LONG TERM GOAL #3   Title  Pt will improve left grip strength by 15# and pinch strength by 5# to improve ability to grasp and maintain hold of lightweight items.     Time  6    Period  Weeks    Status  Revised      OT LONG TERM GOAL #4   Title  Pt will improve left wrist strength to 3-/5 to improve ability to use LUE as assist during meal preparation tasks.     Time  6    Period  Weeks    Status  On-going            Plan - 08/18/17 1721    Clinical Impression Statement  A: Pt reports she has been wearing her flexion glove  3x/day for 30 minutes or more. Slight improvement in flexion of digits noted today. Continued working on digit and hand mobility, added 3# digitizer.     Plan  P: Continue working on improved joint mobility in hand and digits, follow up on continued use of flexion glove.     OT Home Exercise Plan  10/2: wrist/forearm A/ROM; 11/9: joint blocking; 11/14: flexion glove       Patient will benefit from skilled therapeutic intervention in order to improve the following deficits and impairments:  Increased muscle spasms, Decreased scar mobility, Impaired flexibility, Decreased strength, Decreased activity tolerance, Decreased mobility, Pain, Increased edema, Impaired tone, Decreased range of motion, Decreased coordination, Increased fascial restricitons, Impaired UE functional use  Visit Diagnosis: Other symptoms and signs involving the musculoskeletal system  Stiffness of left wrist, not elsewhere classified  Stiffness of left hand, not elsewhere classified    Problem List Patient Active Problem List   Diagnosis Date Noted  . Left carotid bruit 03/15/2013  . Aortic valve stenosis, mild 03/15/2013  . Essential hypertension   . Hypercholesteremia   . Coronary artery disease 09/02/2009   Guadelupe Sabin, OTR/L  [phone] 08/18/2017, 5:33 PM  [facility] [address] [place], [place], [address] Phone: [phone]   Fax:  [phone]  Name: Ann Patel MRN: [id number] Date of Birth: April 21, 1946

## 2017-08-21 ENCOUNTER — Ambulatory Visit (HOSPITAL_COMMUNITY): Payer: Medicare HMO | Admitting: Specialist

## 2017-08-21 ENCOUNTER — Encounter (HOSPITAL_COMMUNITY): Payer: Self-pay | Admitting: Specialist

## 2017-08-21 DIAGNOSIS — M25632 Stiffness of left wrist, not elsewhere classified: Secondary | ICD-10-CM

## 2017-08-21 DIAGNOSIS — M25661 Stiffness of right knee, not elsewhere classified: Secondary | ICD-10-CM | POA: Diagnosis not present

## 2017-08-21 DIAGNOSIS — R29898 Other symptoms and signs involving the musculoskeletal system: Secondary | ICD-10-CM

## 2017-08-21 DIAGNOSIS — M25642 Stiffness of left hand, not elsewhere classified: Secondary | ICD-10-CM

## 2017-08-21 NOTE — Therapy (Signed)
[place], [place], [address] Phone: [phone]   Fax:  [phone]  Occupational Therapy Treatment  Patient Details  Name: Ann Patel MRN: [id number] Date of Birth: [birth year] Referring Provider: Corene Cornea Heggerick, Pa-C   Encounter Date: 08/21/2017  OT End of Session - 08/21/17 1236    Visit Number  21    Number of Visits  23    Date for OT Re-Evaluation  09/03/17    Authorization Type  Car Insurance being billed; pt is bringing information    OT Start Time  [phone]    OT Stop Time  1032    OT Time Calculation (min)  45 min    Activity Tolerance  Patient tolerated treatment well    Behavior During Therapy  [facility] for tasks assessed/performed       Past Medical History:  Diagnosis Date  . Coronary artery disease 09/2009   stress test EF 67%  . Hypercholesteremia   . Hypertension   . Myocardial infarction [facility])     Past Surgical History:  Procedure Laterality Date  . BREAST CYST EXCISION     fibro cyst removal  . CORONARY ANGIOPLASTY WITH STENT PLACEMENT Right 07/2005   receiving two consecutive drug eluting 2.5x49m Taxus stents in the proximal to mid right coronary artery  . TONSILLECTOMY      There were no vitals filed for this visit.  Subjective Assessment - 08/21/17 1235    Subjective   S:  I tape my fingers together sometimes because the small finger wont do what I want it to do.     Currently in Pain?  No/denies         [facility] - [facility]      Assessment   Diagnosis  s/p ORIF for left distal radius and ulnar fracture               OT Treatments/Exercises (OP) - 08/21/17 0001      Exercises   Exercises  Wrist;Hand      Wrist Exercises   Forearm Supination  PROM;AROM;10 reps    Forearm Pronation  PROM;AROM;10 reps    Wrist Flexion  PROM;AROM;10 reps    Wrist Extension  PROM;AROM;10 reps    Wrist Radial Deviation  PROM;AROM;10 reps    Wrist Ulnar  Deviation  PROM;AROM;10 reps      Additional Wrist Exercises   Theraputty - Roll  red    Theraputty - Grip  red    Theraputty - Pinch  6 beads      Hand Exercises   Joint Blocking Exercises  P/ROM 5 times each digit, each joint    Digit Composite ABduction  AROM;5 reps    Digit Composite ADduction  AROM;5 reps    Thumb Opposition  to each digit, with difficulty keeping small finger from flexing when attempting to oppose to long finger and ring finger      Splinting   Splinting  issued buddy tape for small and ring digits of left hand. patient instructed to wear buddy tape when not utilzing flexion gove in order to improve alignment of small digit      Manual Therapy   Manual Therapy  Myofascial release    Manual therapy comments  completed separately from therapeutic exercises    Myofascial Release  Myofascial release to left dorsal hand and wrist and myofascial hand spreading to decrease pain and fascial restrictions and increase joint range of motion.  OT Short Term Goals - 08/04/17 1758      OT SHORT TERM GOAL #1   Title  Pt will be educated on HEP for improved use of LUE during B/IADL completion.     Time  3    Period  Weeks    Status  On-going      OT SHORT TERM GOAL #2   Title  Pt will improve left hand/wrist P/ROM by 10 degrees to improve ability to use LUE as assist during dressing tasks.     Time  3    Period  Weeks    Status  Partially Met        OT Long Term Goals - 08/04/17 1758      OT LONG TERM GOAL #1   Title  Pt will improve A/ROM of left hand and wrist by 15 degrees to improve ability to use LUE as assist during bathing tasks.     Time  6    Period  Weeks    Status  Partially Met      OT LONG TERM GOAL #2   Title  Pt will increase functional use of LUE by using left hand for ADL completion a minimum of 25% of the time.     Time  6    Period  Weeks    Status  On-going      OT LONG TERM GOAL #3   Title  Pt will improve left  grip strength by 15# and pinch strength by 5# to improve ability to grasp and maintain hold of lightweight items.     Time  6    Period  Weeks    Status  Revised      OT LONG TERM GOAL #4   Title  Pt will improve left wrist strength to 3-/5 to improve ability to use LUE as assist during meal preparation tasks.     Time  6    Period  Weeks    Status  On-going            Plan - 08/21/17 1236    Clinical Impression Statement  A:  Patient able to tolerate increased passive stretching of hand into flexion this date.  Therapist fit and educated patient on buddy tape for left ring finger and small finger in order to improve alignment of left small finger during functional tasks.      Plan  P:  Follow up on buddy tape use.  Focus on in hand manipulation activities and gross graspy.        Patient will benefit from skilled therapeutic intervention in order to improve the following deficits and impairments:  Increased muscle spasms, Decreased scar mobility, Impaired flexibility, Decreased strength, Decreased activity tolerance, Decreased mobility, Pain, Increased edema, Impaired tone, Decreased range of motion, Decreased coordination, Increased fascial restricitons, Impaired UE functional use  Visit Diagnosis: Other symptoms and signs involving the musculoskeletal system  Stiffness of left wrist, not elsewhere classified  Stiffness of left hand, not elsewhere classified    Problem List Patient Active Problem List   Diagnosis Date Noted  . Left carotid bruit 03/15/2013  . Aortic valve stenosis, mild 03/15/2013  . Essential hypertension   . Hypercholesteremia   . Coronary artery disease 09/02/2009    Vangie Bicker, [place], OTR/L [phone]  08/21/2017, 12:38 PM  [place] [address] [place], [place], [address] Phone: [phone]   Fax:  [phone]  Name: NANDANA KROLIKOWSKI  MRN: [id number] Date of Birth: [birth year]

## 2017-08-22 ENCOUNTER — Ambulatory Visit (HOSPITAL_COMMUNITY): Payer: Medicare HMO | Admitting: Occupational Therapy

## 2017-08-22 ENCOUNTER — Encounter (HOSPITAL_COMMUNITY): Payer: Self-pay | Admitting: Occupational Therapy

## 2017-08-22 ENCOUNTER — Other Ambulatory Visit: Payer: Self-pay

## 2017-08-22 DIAGNOSIS — R29898 Other symptoms and signs involving the musculoskeletal system: Secondary | ICD-10-CM

## 2017-08-22 DIAGNOSIS — M25632 Stiffness of left wrist, not elsewhere classified: Secondary | ICD-10-CM

## 2017-08-22 DIAGNOSIS — M25642 Stiffness of left hand, not elsewhere classified: Secondary | ICD-10-CM

## 2017-08-22 DIAGNOSIS — M25661 Stiffness of right knee, not elsewhere classified: Secondary | ICD-10-CM | POA: Diagnosis not present

## 2017-08-22 NOTE — Therapy (Signed)
[place], [place], [address] Phone: [phone]   Fax:  [phone]  Occupational Therapy Treatment  Patient Details  Name: Ann Patel MRN: [id number] Date of Birth: [birth year] Referring Provider: Corene Cornea Heggerick, Pa-C   Encounter Date: 08/22/2017  OT End of Session - 08/22/17 1654    Visit Number  22    Number of Visits  23    Date for OT Re-Evaluation  09/03/17    Authorization Type  Car Insurance being billed; pt is bringing information    OT Start Time  1435    OT Stop Time  1516    OT Time Calculation (min)  41 min    Activity Tolerance  Patient tolerated treatment well    Behavior During Therapy  [facility] for tasks assessed/performed       Past Medical History:  Diagnosis Date  . Coronary artery disease 09/2009   stress test EF 67%  . Hypercholesteremia   . Hypertension   . Myocardial infarction [facility])     Past Surgical History:  Procedure Laterality Date  . BREAST CYST EXCISION     fibro cyst removal  . CORONARY ANGIOPLASTY WITH STENT PLACEMENT Right 07/2005   receiving two consecutive drug eluting 2.5x68m Taxus stents in the proximal to mid right coronary artery  . TONSILLECTOMY      There were no vitals filed for this visit.  Subjective Assessment - 08/22/17 1436    Subjective   S: My fingers are going down further with that glove on.     Currently in Pain?  No/denies         [facility] - 08/22/17 1436      Assessment   Diagnosis  s/p ORIF for left distal radius and ulnar fracture      Precautions   Precautions  None               OT Treatments/Exercises (OP) - 08/22/17 1436      Exercises   Exercises  Wrist;Hand      Wrist Exercises   Forearm Supination  PROM;5 reps    Forearm Pronation  PROM;5 reps    Wrist Flexion  PROM;5 reps    Wrist Extension  PROM;5 reps      Additional Wrist Exercises   Sponges  in hand manipulation 11, 12    Hand Gripper  with Large Beads  all beads gripper set at 7# (regular hold)    Hand Gripper with Medium Beads  all beads gripper set at 7# (regular hold)      Hand Exercises   MCPJ Flexion  PROM;5 reps    MCPJ Extension  PROM;5 reps    Joint Blocking Exercises  P/ROM 5 times each digit, each joint    Digit Composite ADduction  AROM;10 reps    Digiticizer  3#, 10X      Manual Therapy   Manual Therapy  Myofascial release    Manual therapy comments  completed separately from therapeutic exercises    Myofascial Release  Myofascial release to left dorsal hand and wrist and myofascial hand spreading to decrease pain and fascial restrictions and increase joint range of motion.                OT Short Term Goals - 08/04/17 1758      OT SHORT TERM GOAL #1   Title  Pt will be educated on HEP for improved use of LUE during B/IADL completion.  Time  3    Period  Weeks    Status  On-going      OT SHORT TERM GOAL #2   Title  Pt will improve left hand/wrist P/ROM by 10 degrees to improve ability to use LUE as assist during dressing tasks.     Time  3    Period  Weeks    Status  Partially Met        OT Long Term Goals - 08/04/17 1758      OT LONG TERM GOAL #1   Title  Pt will improve A/ROM of left hand and wrist by 15 degrees to improve ability to use LUE as assist during bathing tasks.     Time  6    Period  Weeks    Status  Partially Met      OT LONG TERM GOAL #2   Title  Pt will increase functional use of LUE by using left hand for ADL completion a minimum of 25% of the time.     Time  6    Period  Weeks    Status  On-going      OT LONG TERM GOAL #3   Title  Pt will improve left grip strength by 15# and pinch strength by 5# to improve ability to grasp and maintain hold of lightweight items.     Time  6    Period  Weeks    Status  Revised      OT LONG TERM GOAL #4   Title  Pt will improve left wrist strength to 3-/5 to improve ability to use LUE as assist during meal preparation  tasks.     Time  6    Period  Weeks    Status  On-going            Plan - 08/22/17 1505    Clinical Impression Statement  A: Continued with manual therapy. Pt able to complete gripper exercises with gripper in regular vertical hold versus sideways hold for first time today. Pt reports sweeping and mopping are becoming easier now. Pt brought wrist brace in, educated pt on wearing during lifting tasks however leaving off during ADLs as it hinders wrist flexion/extension. Pt reports buddy tape is working well.     Plan  P: Continue focusing on in hand manipulation and gross grasp, as well as improved ROM of digits.     OT Home Exercise Plan  10/2: wrist/forearm A/ROM; 11/9: joint blocking; 11/14: flexion glove       Patient will benefit from skilled therapeutic intervention in order to improve the following deficits and impairments:  Increased muscle spasms, Decreased scar mobility, Impaired flexibility, Decreased strength, Decreased activity tolerance, Decreased mobility, Pain, Increased edema, Impaired tone, Decreased range of motion, Decreased coordination, Increased fascial restricitons, Impaired UE functional use  Visit Diagnosis: Other symptoms and signs involving the musculoskeletal system  Stiffness of left wrist, not elsewhere classified  Stiffness of left hand, not elsewhere classified    Problem List Patient Active Problem List   Diagnosis Date Noted  . Left carotid bruit 03/15/2013  . Aortic valve stenosis, mild 03/15/2013  . Essential hypertension   . Hypercholesteremia   . Coronary artery disease 09/02/2009   Guadelupe Sabin, OTR/L  [phone] 08/22/2017, 4:54 PM  [place] [address] [place], [place], [address] Phone: [phone]   Fax:  [phone]  Name: Ann Patel MRN: [id number] Date of Birth: [birth year]

## 2017-08-23 ENCOUNTER — Ambulatory Visit (HOSPITAL_COMMUNITY): Payer: Medicare HMO

## 2017-08-28 ENCOUNTER — Ambulatory Visit (HOSPITAL_COMMUNITY): Payer: Medicare HMO

## 2017-08-28 ENCOUNTER — Encounter (HOSPITAL_COMMUNITY): Payer: Self-pay

## 2017-08-28 DIAGNOSIS — M25661 Stiffness of right knee, not elsewhere classified: Secondary | ICD-10-CM | POA: Diagnosis not present

## 2017-08-28 DIAGNOSIS — M25632 Stiffness of left wrist, not elsewhere classified: Secondary | ICD-10-CM | POA: Diagnosis not present

## 2017-08-28 DIAGNOSIS — M25642 Stiffness of left hand, not elsewhere classified: Secondary | ICD-10-CM | POA: Diagnosis not present

## 2017-08-28 DIAGNOSIS — R29898 Other symptoms and signs involving the musculoskeletal system: Secondary | ICD-10-CM | POA: Diagnosis not present

## 2017-08-28 NOTE — Therapy (Signed)
[place], [place], [address] Phone: [phone]   Fax:  [phone]  Occupational Therapy Treatment  Patient Details  Name: Ann Patel MRN: [id number] Date of Birth: 05/01/1946 Referring Provider: Corene Cornea Heggerick, Pa-C   Encounter Date: 08/28/2017  OT End of Session - 08/28/17 1157    Visit Number  23    Number of Visits  25    Date for OT Re-Evaluation  09/03/17    Authorization Type  Car Insurance being billed; pt is bringing information    OT Start Time  0950    OT Stop Time  1034    OT Time Calculation (min)  44 min    Activity Tolerance  Patient tolerated treatment well    Behavior During Therapy  [facility] for tasks assessed/performed       Past Medical History:  Diagnosis Date  . Coronary artery disease 09/2009   stress test EF 67%  . Hypercholesteremia   . Hypertension   . Myocardial infarction [facility])     Past Surgical History:  Procedure Laterality Date  . BREAST CYST EXCISION     fibro cyst removal  . CORONARY ANGIOPLASTY WITH STENT PLACEMENT Right 07/2005   receiving two consecutive drug eluting 2.5x87m Taxus stents in the proximal to mid right coronary artery  . TONSILLECTOMY      There were no vitals filed for this visit.  Subjective Assessment - 08/28/17 1154    Subjective   S: They are coming around slowly.    Currently in Pain?  No/denies         [facility] - 08/28/17 1155      Assessment   Diagnosis  s/p ORIF for left distal radius and ulnar fracture      Precautions   Precautions  None               OT Treatments/Exercises (OP) - 08/28/17 1155      Exercises   Exercises  Wrist;Hand      Additional Wrist Exercises   Sponges  in hand manipulation 9,13    Hand Gripper with Large Beads  all beads gripper set at 7# (regular hold)    Hand Gripper with Medium Beads  all beads gripper set at 7# (regular hold)      Hand Exercises   MCPJ Flexion   PROM;5 reps    MCPJ Extension  PROM;5 reps    PIPJ Flexion  PROM;5 reps    PIPJ Extension  PROM;5 reps    DIPJ Flexion  PROM;5 reps    DIPJ Extension  PROM;5 reps    Joint Blocking Exercises  P/ROM 5 times each digit, each joint    Opposition  PROM;5 reps      Manual Therapy   Manual Therapy  Myofascial release    Manual therapy comments  completed separately from therapeutic exercises    Myofascial Release  Myofascial release to left dorsal hand and wrist and myofascial hand spreading to decrease pain and fascial restrictions and increase joint range of motion.                OT Short Term Goals - 08/04/17 1758      OT SHORT TERM GOAL #1   Title  Pt will be educated on HEP for improved use of LUE during B/IADL completion.     Time  3    Period  Weeks    Status  On-going  OT SHORT TERM GOAL #2   Title  Pt will improve left hand/wrist P/ROM by 10 degrees to improve ability to use LUE as assist during dressing tasks.     Time  3    Period  Weeks    Status  Partially Met        OT Long Term Goals - 08/04/17 1758      OT LONG TERM GOAL #1   Title  Pt will improve A/ROM of left hand and wrist by 15 degrees to improve ability to use LUE as assist during bathing tasks.     Time  6    Period  Weeks    Status  Partially Met      OT LONG TERM GOAL #2   Title  Pt will increase functional use of LUE by using left hand for ADL completion a minimum of 25% of the time.     Time  6    Period  Weeks    Status  On-going      OT LONG TERM GOAL #3   Title  Pt will improve left grip strength by 15# and pinch strength by 5# to improve ability to grasp and maintain hold of lightweight items.     Time  6    Period  Weeks    Status  Revised      OT LONG TERM GOAL #4   Title  Pt will improve left wrist strength to 3-/5 to improve ability to use LUE as assist during meal preparation tasks.     Time  6    Period  Weeks    Status  On-going            Plan - 08/28/17  1252    Clinical Impression Statement  A: Continued to focus on hand and finger mobility during session. Patient reports that she is wearing her flexion glove usually twice a day for 45 minutes. She does report increase pain when initially removing glove. Slight movement noted during passive stretching of thumb IP joint.    Plan  P: Continue focusing on in hand manipulation and gross grasp while improving ROM of digits.     Consulted and Agree with Plan of Care  Patient       Patient will benefit from skilled therapeutic intervention in order to improve the following deficits and impairments:  Increased muscle spasms, Decreased scar mobility, Impaired flexibility, Decreased strength, Decreased activity tolerance, Decreased mobility, Pain, Increased edema, Impaired tone, Decreased range of motion, Decreased coordination, Increased fascial restricitons, Impaired UE functional use  Visit Diagnosis: Other symptoms and signs involving the musculoskeletal system  Stiffness of left wrist, not elsewhere classified  Stiffness of left hand, not elsewhere classified  Stiffness of right knee, not elsewhere classified    Problem List Patient Active Problem List   Diagnosis Date Noted  . Left carotid bruit 03/15/2013  . Aortic valve stenosis, mild 03/15/2013  . Essential hypertension   . Hypercholesteremia   . Coronary artery disease 09/02/2009   Ailene Ravel, OTR/L,CBIS  [phone]  08/28/2017, 12:57 PM  [place] [address] [place], [place], [address] Phone: [phone]   Fax:  [phone]  Name: Ann Patel MRN: [id number] Date of Birth: [birth year]

## 2017-08-30 ENCOUNTER — Encounter (HOSPITAL_COMMUNITY): Payer: Self-pay | Admitting: Occupational Therapy

## 2017-08-30 ENCOUNTER — Other Ambulatory Visit: Payer: Self-pay

## 2017-08-30 ENCOUNTER — Ambulatory Visit (HOSPITAL_COMMUNITY): Payer: Medicare HMO | Admitting: Occupational Therapy

## 2017-08-30 DIAGNOSIS — M25632 Stiffness of left wrist, not elsewhere classified: Secondary | ICD-10-CM | POA: Diagnosis not present

## 2017-08-30 DIAGNOSIS — M25642 Stiffness of left hand, not elsewhere classified: Secondary | ICD-10-CM | POA: Diagnosis not present

## 2017-08-30 DIAGNOSIS — M25661 Stiffness of right knee, not elsewhere classified: Secondary | ICD-10-CM | POA: Diagnosis not present

## 2017-08-30 DIAGNOSIS — R29898 Other symptoms and signs involving the musculoskeletal system: Secondary | ICD-10-CM | POA: Diagnosis not present

## 2017-08-30 NOTE — Therapy (Signed)
[place], [place], [address] Phone: [phone]   Fax:  [phone]  Occupational Therapy Treatment  Patient Details  Name: Ann Patel MRN: [id number] Date of Birth: 26-Aug-1946 Referring Provider: Corene Cornea Heggerick, Pa-C   Encounter Date: 08/30/2017  OT End of Session - 08/30/17 1033    Visit Number  24    Number of Visits  25    Date for OT Re-Evaluation  09/03/17    Authorization Type  Car Insurance being billed; pt is bringing information    OT Start Time  [phone]    OT Stop Time  1031    OT Time Calculation (min)  40 min    Activity Tolerance  Patient tolerated treatment well    Behavior During Therapy  [facility] for tasks assessed/performed       Past Medical History:  Diagnosis Date  . Coronary artery disease 09/2009   stress test EF 67%  . Hypercholesteremia   . Hypertension   . Myocardial infarction [facility])     Past Surgical History:  Procedure Laterality Date  . BREAST CYST EXCISION     fibro cyst removal  . CORONARY ANGIOPLASTY WITH STENT PLACEMENT Right 07/2005   receiving two consecutive drug eluting 2.5x6m Taxus stents in the proximal to mid right coronary artery  . TONSILLECTOMY      There were no vitals filed for this visit.  Subjective Assessment - 08/30/17 0952    Subjective   S: That middle finger gets really sore.     Currently in Pain?  No/denies         [facility] - 08/30/17 0952      Assessment   Diagnosis  s/p ORIF for left distal radius and ulnar fracture      Precautions   Precautions  None               OT Treatments/Exercises (OP) - 08/30/17 0953      Exercises   Exercises  Wrist;Hand      Additional Wrist Exercises   Sponges  in hand manipulation 11 ,14    Theraputty - Roll  red    Theraputty - Grip  red    Theraputty - Pinch  2 point and 3 point      Hand Exercises   MCPJ Flexion  PROM;5 reps    MCPJ Extension  PROM;5 reps    PIPJ  Flexion  PROM;5 reps    PIPJ Extension  PROM;5 reps    DIPJ Flexion  PROM;5 reps    DIPJ Extension  PROM;5 reps    Joint Blocking Exercises  P/ROM 5 times each digit, each joint    Opposition  PROM;5 reps      Manual Therapy   Manual Therapy  Myofascial release    Manual therapy comments  completed separately from therapeutic exercises    Myofascial Release  Myofascial release to left dorsal hand and wrist and myofascial hand spreading to decrease pain and fascial restrictions and increase joint range of motion.              OT Education - 08/30/17 1026    Education provided  Yes    Education Details  theraputty exercises    Person(s) Educated  Patient    Methods  Explanation;Demonstration;Handout    Comprehension  Verbalized understanding;Returned demonstration       OT Short Term Goals - 08/04/17 1758      OT SHORT TERM  GOAL #1   Title  Pt will be educated on HEP for improved use of LUE during B/IADL completion.     Time  3    Period  Weeks    Status  On-going      OT SHORT TERM GOAL #2   Title  Pt will improve left hand/wrist P/ROM by 10 degrees to improve ability to use LUE as assist during dressing tasks.     Time  3    Period  Weeks    Status  Partially Met        OT Long Term Goals - 08/04/17 1758      OT LONG TERM GOAL #1   Title  Pt will improve A/ROM of left hand and wrist by 15 degrees to improve ability to use LUE as assist during bathing tasks.     Time  6    Period  Weeks    Status  Partially Met      OT LONG TERM GOAL #2   Title  Pt will increase functional use of LUE by using left hand for ADL completion a minimum of 25% of the time.     Time  6    Period  Weeks    Status  On-going      OT LONG TERM GOAL #3   Title  Pt will improve left grip strength by 15# and pinch strength by 5# to improve ability to grasp and maintain hold of lightweight items.     Time  6    Period  Weeks    Status  Revised      OT LONG TERM GOAL #4   Title  Pt  will improve left wrist strength to 3-/5 to improve ability to use LUE as assist during meal preparation tasks.     Time  6    Period  Weeks    Status  On-going            Plan - 08/30/17 1033    Clinical Impression Statement  A: Session focusing on hand and finger mobility, pt reports she has less difficulty with some things such as opening jars. Pt is wearing flexion glove 2x/day, educated on trying to get 3 sessions in. Pt continues to have slight movement at IP joints, PIP joint of digits 2-5 with improved mobility. Provided red theraputty HEP today.     Plan  P: Reassessment    OT Home Exercise Plan  10/2: wrist/forearm A/ROM; 11/9: joint blocking; 11/14: flexion glove    Consulted and Agree with Plan of Care  Patient       Patient will benefit from skilled therapeutic intervention in order to improve the following deficits and impairments:  Increased muscle spasms, Decreased scar mobility, Impaired flexibility, Decreased strength, Decreased activity tolerance, Decreased mobility, Pain, Increased edema, Impaired tone, Decreased range of motion, Decreased coordination, Increased fascial restricitons, Impaired UE functional use  Visit Diagnosis: Other symptoms and signs involving the musculoskeletal system  Stiffness of left wrist, not elsewhere classified  Stiffness of left hand, not elsewhere classified    Problem List Patient Active Problem List   Diagnosis Date Noted  . Left carotid bruit 03/15/2013  . Aortic valve stenosis, mild 03/15/2013  . Essential hypertension   . Hypercholesteremia   . Coronary artery disease 09/02/2009   Guadelupe Sabin, OTR/L  [phone] 08/30/2017, 10:36 AM  [place] [address] [place], [place], [address] Phone: [phone]   Fax:  [phone]  Name: Ann Patel MRN: [id number] Date of Birth: 07/11/1946

## 2017-08-30 NOTE — Patient Instructions (Signed)
Home Exercises Program Theraputty Exercises  Do the following exercises 2 times a day using your affected hand.  1. Roll putty into a ball.  2. Make into a pancake.  3. Roll putty into a roll.  4. Pinch along log with first finger and thumb.   5. Make into a ball.  6. Roll it back into a log.   7. Pinch using thumb and side of first finger.  8. Roll into a ball, then flatten into a pancake.

## 2017-09-01 ENCOUNTER — Other Ambulatory Visit: Payer: Self-pay

## 2017-09-01 ENCOUNTER — Ambulatory Visit (HOSPITAL_COMMUNITY): Payer: Medicare HMO | Admitting: Occupational Therapy

## 2017-09-01 ENCOUNTER — Encounter (HOSPITAL_COMMUNITY): Payer: Self-pay | Admitting: Occupational Therapy

## 2017-09-01 DIAGNOSIS — M25632 Stiffness of left wrist, not elsewhere classified: Secondary | ICD-10-CM | POA: Diagnosis not present

## 2017-09-01 DIAGNOSIS — M25661 Stiffness of right knee, not elsewhere classified: Secondary | ICD-10-CM | POA: Diagnosis not present

## 2017-09-01 DIAGNOSIS — M25642 Stiffness of left hand, not elsewhere classified: Secondary | ICD-10-CM

## 2017-09-01 DIAGNOSIS — R29898 Other symptoms and signs involving the musculoskeletal system: Secondary | ICD-10-CM

## 2017-09-01 NOTE — Therapy (Signed)
[facility] [place], [place], [address] Phone: [phone]   Fax:  [phone]  Occupational Therapy Treatment  Patient Details  Name: Ann Patel MRN: [id number] Date of Birth: 10-07-1945 Referring Provider: Corene Cornea Heggerick, Pa-C   Encounter Date: 09/01/2017  OT End of Session - 09/01/17 1205    Visit Number  25    Number of Visits  29    Date for OT Re-Evaluation  09/15/17    Authorization Type  Car Insurance being billed; pt is bringing information    OT Start Time  1120    OT Stop Time  1202    OT Time Calculation (min)  42 min    Activity Tolerance  Patient tolerated treatment well    Behavior During Therapy  [facility] for tasks assessed/performed       Past Medical History:  Diagnosis Date  . Coronary artery disease 09/2009   stress test EF 67%  . Hypercholesteremia   . Hypertension   . Myocardial infarction [facility])     Past Surgical History:  Procedure Laterality Date  . BREAST CYST EXCISION     fibro cyst removal  . CORONARY ANGIOPLASTY WITH STENT PLACEMENT Right 07/2005   receiving two consecutive drug eluting 2.5x46m Taxus stents in the proximal to mid right coronary artery  . TONSILLECTOMY      There were no vitals filed for this visit.  Subjective Assessment - 09/01/17 1122    Subjective   S: I can use this left hand to help tie my shoes now.     Currently in Pain?  No/denies         [facility] - 09/01/17 1121      Assessment   Diagnosis  s/p ORIF for left distal radius and ulnar fracture      Precautions   Precautions  None      Strength   Left Hand Gross Grasp  Impaired    Left Hand Grip (lbs)  15 10 previous    Left Hand Lateral Pinch  10 lbs 7 previous    Left Hand 3 Point Pinch  9 lbs 7 previous      Left Hand AROM   L Thumb MCP 0-60  54 Degrees same as previous    L Thumb IP 0-80  8 Degrees 6 previous    L Index  MCP 0-90  62 Degrees 62    L Index PIP 0-100  48  Degrees 44    L Index DIP 0-70  16 Degrees 12    L Long  MCP 0-90  66 Degrees 65    L Long PIP 0-100  64 Degrees 60    L Long DIP 0-70  16 Degrees 12    L Ring  MCP 0-90  56 Degrees 52    L Ring PIP 0-100  64 Degrees 58    L Ring DIP 0-70  12 Degrees 4    L Little  MCP 0-90  56 Degrees 52    L Little PIP 0-100  72 Degrees 58    L Little DIP 0-70  6 Degrees 6               OT Treatments/Exercises (OP) - 09/01/17 1158      Exercises   Exercises  Wrist;Hand      Additional Wrist Exercises   Hand Gripper with Large Beads  all beads gripper set at 11# (regular hold)  Hand Gripper with Medium Beads  all beads gripper set at 11# (regular hold)    Hand Gripper with Small Beads  6 beads gripper set at 11#      Hand Exercises   MCPJ Flexion  PROM;5 reps    MCPJ Extension  PROM;5 reps    PIPJ Flexion  PROM;5 reps    PIPJ Extension  PROM;5 reps    DIPJ Flexion  PROM;5 reps    DIPJ Extension  PROM;5 reps    Joint Blocking Exercises  P/ROM 5 times each digit, each joint    Opposition  PROM;5 reps               OT Short Term Goals - 08/04/17 1758      OT SHORT TERM GOAL #1   Title  Pt will be educated on HEP for improved use of LUE during B/IADL completion.     Time  3    Period  Weeks    Status  On-going      OT SHORT TERM GOAL #2   Title  Pt will improve left hand/wrist P/ROM by 10 degrees to improve ability to use LUE as assist during dressing tasks.     Time  3    Period  Weeks    Status  Partially Met        OT Long Term Goals - 09/01/17 1159      OT LONG TERM GOAL #1   Title  Pt will improve A/ROM of left hand and wrist by 15 degrees to improve ability to use LUE as assist during bathing tasks.     Time  6    Period  Weeks    Status  Partially Met      OT LONG TERM GOAL #2   Title  Pt will increase functional use of LUE by using left hand for ADL completion a minimum of 25% of the time.     Time  6    Period  Weeks    Status  Achieved       OT LONG TERM GOAL #3   Title  Pt will improve left grip strength by 15# and pinch strength by 5# to improve ability to grasp and maintain hold of lightweight items.     Time  6    Period  Weeks    Status  Partially Met      OT LONG TERM GOAL #4   Title  Pt will improve left wrist strength to 3-/5 to improve ability to use LUE as assist during meal preparation tasks.     Time  6    Period  Weeks    Status  Achieved            Plan - 09/01/17 1202    Clinical Impression Statement  A: Reassessment completed today, pt has partially met 1/2 STGs, has met 2/4 LTGs and has partially met remaining 2 LTGs. Pt has made minimal progress in ROM of digits since previous assessment, has made some progress with grip and pinch strengthening. Discussed progress with pt and pt is agreeable to complete 4 additional sessions focusing on grip and pinch strength required for grasping and picking up lightweight objects and then discharging with HEP.     OT Frequency  2x / week    OT Duration  2 weeks    OT Treatment/Interventions  Self-care/ADL training;Therapeutic exercise;Patient/family education;Ultrasound;Neuromuscular education;Manual Therapy;Splinting;Parrafin;DME and/or AE instruction;Therapeutic activities;Electrical Stimulation;Moist Heat;Contrast Bath;Passive range of motion  Plan  P: D/C passive stretching and manual techniques, focusing sessions on functional ROM during tasks and grip/pinch strengthening.    OT Home Exercise Plan  10/2: wrist/forearm A/ROM; 11/9: joint blocking; 11/14: flexion glove    Consulted and Agree with Plan of Care  Patient       Patient will benefit from skilled therapeutic intervention in order to improve the following deficits and impairments:  Increased muscle spasms, Decreased scar mobility, Impaired flexibility, Decreased strength, Decreased activity tolerance, Decreased mobility, Pain, Increased edema, Impaired tone, Decreased range of motion, Decreased  coordination, Increased fascial restricitons, Impaired UE functional use  Visit Diagnosis: Other symptoms and signs involving the musculoskeletal system  Stiffness of left wrist, not elsewhere classified  Stiffness of left hand, not elsewhere classified    Problem List Patient Active Problem List   Diagnosis Date Noted  . Left carotid bruit 03/15/2013  . Aortic valve stenosis, mild 03/15/2013  . Essential hypertension   . Hypercholesteremia   . Coronary artery disease 09/02/2009   Guadelupe Sabin, OTR/L  [phone] 09/01/2017, 12:07 PM  [facility] [address] [place], [place], [address] Phone: [phone]   Fax:  [phone]  Name: Ann Patel MRN: [id number] Date of Birth: 12-26-1945

## 2017-09-05 ENCOUNTER — Ambulatory Visit (HOSPITAL_COMMUNITY): Payer: Medicare HMO | Attending: Physician Assistant | Admitting: Specialist

## 2017-09-05 ENCOUNTER — Encounter (HOSPITAL_COMMUNITY): Payer: Self-pay | Admitting: Specialist

## 2017-09-05 DIAGNOSIS — R29898 Other symptoms and signs involving the musculoskeletal system: Secondary | ICD-10-CM | POA: Diagnosis not present

## 2017-09-05 DIAGNOSIS — M25642 Stiffness of left hand, not elsewhere classified: Secondary | ICD-10-CM | POA: Diagnosis not present

## 2017-09-05 DIAGNOSIS — M25632 Stiffness of left wrist, not elsewhere classified: Secondary | ICD-10-CM | POA: Diagnosis not present

## 2017-09-05 NOTE — Therapy (Signed)
[place], [place], [address] Phone: [phone]   Fax:  [phone]  Occupational Therapy Treatment  Patient Details  Name: Ann Patel MRN: [id number] Date of Birth: Apr 01, 1946 Referring Provider: Corene Cornea Heggerick, Pa-C   Encounter Date: 09/05/2017  OT End of Session - 09/05/17 1139    Visit Number  26    Number of Visits  29    Date for OT Re-Evaluation  09/15/17    Authorization Type  Car Insurance being billed; pt is bringing information    OT Start Time  1050    OT Stop Time  1130    OT Time Calculation (min)  40 min    Activity Tolerance  Patient tolerated treatment well    Behavior During Therapy  [facility] for tasks assessed/performed       Past Medical History:  Diagnosis Date  . Coronary artery disease 09/2009   stress test EF 67%  . Hypercholesteremia   . Hypertension   . Myocardial infarction [facility])     Past Surgical History:  Procedure Laterality Date  . BREAST CYST EXCISION     fibro cyst removal  . CORONARY ANGIOPLASTY WITH STENT PLACEMENT Right 07/2005   receiving two consecutive drug eluting 2.5x59m Taxus stents in the proximal to mid right coronary artery  . TONSILLECTOMY      There were no vitals filed for this visit.  Subjective Assessment - 09/05/17 1138    Subjective   S:  When I wear the glove, my hand just gets really stiff.     Currently in Pain?  No/denies         [facility] - 09/05[facility]17 0001      Assessment   Diagnosis  s/p ORIF for left distal radius and ulnar fracture               OT Treatments/Exercises (OP) - 09/05/17 0001      Exercises   Exercises  Wrist;Hand      Additional Wrist Exercises   Sponges  in hand manipulation 13 X 2 attempts    Theraputty  Flatten;Roll;Grip;Pinch;Locate Pegs    Theraputty - Flatten  red in standing    Theraputty - Roll  red in standing    Theraputty - Grip  red with supinated and pronated grasp    Theraputty - Pinch  2 point pinch, 3 point pinch, and lateral pinch    Theraputty - Locate Pegs  10 beads       Hand Exercises   Other Hand Exercises  picked up max resist sponges with red min resist clothes pin and stacked 5 X 2 attempts in order to build sustained pinch strength needed for functional activities at home                OT Short Term Goals - 08/04/17 1758      OT SHORT TERM GOAL #1   Title  Pt will be educated on HEP for improved use of LUE during B/IADL completion.     Time  3    Period  Weeks    Status  On-going      OT SHORT TERM GOAL #2   Title  Pt will improve left hand/wrist P/ROM by 10 degrees to improve ability to use LUE as assist during dressing tasks.     Time  3    Period  Weeks    Status  Partially Met  OT Long Term Goals - 09/01/17 1159      OT LONG TERM GOAL #1   Title  Pt will improve A/ROM of left hand and wrist by 15 degrees to improve ability to use LUE as assist during bathing tasks.     Time  6    Period  Weeks    Status  Partially Met      OT LONG TERM GOAL #2   Title  Pt will increase functional use of LUE by using left hand for ADL completion a minimum of 25% of the time.     Time  6    Period  Weeks    Status  Achieved      OT LONG TERM GOAL #3   Title  Pt will improve left grip strength by 15# and pinch strength by 5# to improve ability to grasp and maintain hold of lightweight items.     Time  6    Period  Weeks    Status  Partially Met      OT LONG TERM GOAL #4   Title  Pt will improve left wrist strength to 3-/5 to improve ability to use LUE as assist during meal preparation tasks.     Time  6    Period  Weeks    Status  Achieved            Plan - 09/05/17 1139    Clinical Impression Statement  A:  Treatment focused on grip and pinch strengthening, as well as functional grasp.  Patient able to obtain improved grasp in supinated and pronated position on theraputty this date compared to previous  sessions.  Able to pick up max resist sponge with red clothespin and stack 5 blocks, demonstrating improved sustained pinch.     Plan  P:  Continue focus on functional ROM and grip/pinch strengthening, increase to 15 sponges in hand with sponge task and stacking 7 sponges for improved sustained pinch.        Patient will benefit from skilled therapeutic intervention in order to improve the following deficits and impairments:  Increased muscle spasms, Decreased scar mobility, Impaired flexibility, Decreased strength, Decreased mobility, Pain, Increased fascial restrictions, Impaired UE functional use  Visit Diagnosis: Other symptoms and signs involving the musculoskeletal system  Stiffness of left wrist, not elsewhere classified  Stiffness of left hand, not elsewhere classified    Problem List Patient Active Problem List   Diagnosis Date Noted  . Left carotid bruit 03/15/2013  . Aortic valve stenosis, mild 03/15/2013  . Essential hypertension   . Hypercholesteremia   . Coronary artery disease 09/02/2009    Vangie Bicker, [place], OTR/L [phone]  09/05/2017, 11:47 AM  [facility] [address] [place], [place], [address] Phone: [phone]   Fax:  [phone]  Name: PERNIE GROSSO MRN: [id number] Date of Birth: 1946/09/01

## 2017-09-06 ENCOUNTER — Encounter (HOSPITAL_COMMUNITY): Payer: Self-pay | Admitting: Occupational Therapy

## 2017-09-07 ENCOUNTER — Other Ambulatory Visit: Payer: Self-pay

## 2017-09-07 ENCOUNTER — Ambulatory Visit (HOSPITAL_COMMUNITY): Payer: Medicare HMO | Admitting: Occupational Therapy

## 2017-09-07 DIAGNOSIS — M25632 Stiffness of left wrist, not elsewhere classified: Secondary | ICD-10-CM

## 2017-09-07 DIAGNOSIS — R29898 Other symptoms and signs involving the musculoskeletal system: Secondary | ICD-10-CM | POA: Diagnosis not present

## 2017-09-07 DIAGNOSIS — M25642 Stiffness of left hand, not elsewhere classified: Secondary | ICD-10-CM

## 2017-09-07 NOTE — Therapy (Addendum)
[place], [place], [address] Phone: [phone]   Fax:  [phone]  Occupational Therapy Treatment and Discharge  Patient Details  Name: Ann Patel MRN: [id number] Date of Birth: 04/24/1946 Referring Provider: Corene Cornea Heggerick, Pa-C   Encounter Date: 09/07/2017  OT End of Session - 09/07/17 1154    Visit Number  27    Number of Visits  29    Date for OT Re-Evaluation  09/15/17    Authorization Type  Car Insurance being billed; pt is bringing information    OT Start Time  1119    OT Stop Time  1200    OT Time Calculation (min)  41 min    Activity Tolerance  Patient tolerated treatment well    Behavior During Therapy  [facility] for tasks assessed/performed       Past Medical History:  Diagnosis Date  . Coronary artery disease 09/2009   stress test EF 67%  . Hypercholesteremia   . Hypertension   . Myocardial infarction [facility])     Past Surgical History:  Procedure Laterality Date  . BREAST CYST EXCISION     fibro cyst removal  . CORONARY ANGIOPLASTY WITH STENT PLACEMENT Right 07/2005   receiving two consecutive drug eluting 2.5x1m Taxus stents in the proximal to mid right coronary artery  . TONSILLECTOMY      There were no vitals filed for this visit.  Subjective Assessment - 09/07/17 1121    Subjective   S: I slept in that buddy strap last night.     Currently in Pain?  No/denies         [facility] - 09/07/17 1120      Assessment   Diagnosis  s/p ORIF for left distal radius and ulnar fracture      Precautions   Precautions  None               OT Treatments/Exercises (OP) - 09/07/17 1122      Exercises   Exercises  Wrist;Hand      Additional Wrist Exercises   Sponges  in hand manipulation 15, 13    Hand Gripper with Large Beads  all beads gripper set at 25# (regular hold)    Hand Gripper with Medium Beads  all beads gripper set at 25# (regular hold)    Hand Gripper  with Small Beads  3 beads with gripper at 15#      Hand Exercises   Other Hand Exercises  picked up max resist sponges with green and red resistive clothespins and stacked 7 X 2 attempts in order to build sustained pinch strength needed for functional activities at home                OT Short Term Goals - 08/04/17 1758      OT SHORT TERM GOAL #1   Title  Pt will be educated on HEP for improved use of LUE during B/IADL completion.     Time  3    Period  Weeks    Status  On-going      OT SHORT TERM GOAL #2   Title  Pt will improve left hand/wrist P/ROM by 10 degrees to improve ability to use LUE as assist during dressing tasks.     Time  3    Period  Weeks    Status  Partially Met        OT Long Term Goals - 09/01/17 1159  OT LONG TERM GOAL #1   Title  Pt will improve A/ROM of left hand and wrist by 15 degrees to improve ability to use LUE as assist during bathing tasks.     Time  6    Period  Weeks    Status  Partially Met      OT LONG TERM GOAL #2   Title  Pt will increase functional use of LUE by using left hand for ADL completion a minimum of 25% of the time.     Time  6    Period  Weeks    Status  Achieved      OT LONG TERM GOAL #3   Title  Pt will improve left grip strength by 15# and pinch strength by 5# to improve ability to grasp and maintain hold of lightweight items.     Time  6    Period  Weeks    Status  Partially Met      OT LONG TERM GOAL #4   Title  Pt will improve left wrist strength to 3-/5 to improve ability to use LUE as assist during meal preparation tasks.     Time  6    Period  Weeks    Status  Achieved            Plan - 09/07/17 1154    Clinical Impression Statement  A: Increased clothespin activity to green and increased sponges to 7 for stacking task. Continued with grip strengthening increasing gripper to 25# for large beads, pt unable to complete many small beads today due to fatigue. Pt reports sleeping in buddy strap  and continued use of flexion glove.     Plan  P: Continue to focus on functional ROM and grip/pinch strengthening. Add activity pushing marble or bead through theraputty to improve digit strength    OT Home Exercise Plan  10/2: wrist/forearm A/ROM; 11/9: joint blocking; 11/14: flexion glove    Consulted and Agree with Plan of Care  Patient       Patient will benefit from skilled therapeutic intervention in order to improve the following deficits and impairments:  Increased muscle spasms, Decreased scar mobility, Impaired flexibility, Decreased strength, Decreased mobility, Pain, Increased fascial restrictions, Impaired UE functional use  Visit Diagnosis: Other symptoms and signs involving the musculoskeletal system  Stiffness of left wrist, not elsewhere classified  Stiffness of left hand, not elsewhere classified    Problem List Patient Active Problem List   Diagnosis Date Noted  . Left carotid bruit 03/15/2013  . Aortic valve stenosis, mild 03/15/2013  . Essential hypertension   . Hypercholesteremia   . Coronary artery disease 09/02/2009   Guadelupe Sabin, OTR/L  [phone] 09/07/2017, 12:01 PM  [facility] [address] [place], [place], [address] Phone: [phone]   Fax:  [phone]  Name: Ann Patel MRN: [id number] Date of Birth: May 12, 1946    OCCUPATIONAL THERAPY DISCHARGE SUMMARY  Visits from Start of Care: 27  Current functional level related to goals / functional outcomes: Pt did not return for last visit. Pt met 2/4 goals and partially met 2/4 goals, improvements in ROM, strength, and functional use of left hand for ADL completion.    Remaining deficits: Continues to have significant deficits with left hand ROM and functional use. Is unable to create a fist   Education / Equipment: HEP throughout therapy  Plan: Patient agrees to discharge.  Patient goals were partially met. Patient is being discharged  due  to being pleased with the current functional level.  ?????

## 2017-09-11 ENCOUNTER — Ambulatory Visit (HOSPITAL_COMMUNITY): Payer: Medicare HMO | Admitting: Specialist

## 2017-09-14 ENCOUNTER — Telehealth (HOSPITAL_COMMUNITY): Payer: Self-pay

## 2017-09-14 ENCOUNTER — Ambulatory Visit (HOSPITAL_COMMUNITY): Payer: Medicare HMO

## 2017-09-14 NOTE — Telephone Encounter (Signed)
Attempted to call patient regarding no show. Unable to leave a message. Voicemail box has not been set up.   Ailene Ravel, OTR/L,CBIS  [phone]

## 2017-09-19 DIAGNOSIS — M7652 Patellar tendinitis, left knee: Secondary | ICD-10-CM | POA: Diagnosis not present

## 2017-09-19 DIAGNOSIS — S52602D Unspecified fracture of lower end of left ulna, subsequent encounter for closed fracture with routine healing: Secondary | ICD-10-CM | POA: Diagnosis not present

## 2017-09-19 DIAGNOSIS — S82102D Unspecified fracture of upper end of left tibia, subsequent encounter for closed fracture with routine healing: Secondary | ICD-10-CM | POA: Diagnosis not present

## 2017-09-19 DIAGNOSIS — S82122D Displaced fracture of lateral condyle of left tibia, subsequent encounter for closed fracture with routine healing: Secondary | ICD-10-CM | POA: Diagnosis not present

## 2017-09-19 DIAGNOSIS — S52502D Unspecified fracture of the lower end of left radius, subsequent encounter for closed fracture with routine healing: Secondary | ICD-10-CM | POA: Diagnosis not present

## 2017-10-09 DIAGNOSIS — S52602D Unspecified fracture of lower end of left ulna, subsequent encounter for closed fracture with routine healing: Secondary | ICD-10-CM | POA: Diagnosis not present

## 2017-10-09 DIAGNOSIS — M25642 Stiffness of left hand, not elsewhere classified: Secondary | ICD-10-CM | POA: Diagnosis not present

## 2017-10-09 DIAGNOSIS — S52502D Unspecified fracture of the lower end of left radius, subsequent encounter for closed fracture with routine healing: Secondary | ICD-10-CM | POA: Diagnosis not present

## 2018-03-19 ENCOUNTER — Encounter (HOSPITAL_COMMUNITY): Payer: Self-pay | Admitting: *Deleted

## 2018-03-19 ENCOUNTER — Emergency Department (HOSPITAL_COMMUNITY): Payer: Medicare HMO

## 2018-03-19 ENCOUNTER — Observation Stay (HOSPITAL_COMMUNITY)
Admission: EM | Admit: 2018-03-19 | Discharge: 2018-03-20 | Disposition: A | Discharge status: Home / Self Care | Payer: Medicare HMO | Attending: Family Medicine | Admitting: Family Medicine

## 2018-03-19 DIAGNOSIS — I2510 Atherosclerotic heart disease of native coronary artery without angina pectoris: Secondary | ICD-10-CM | POA: Diagnosis present

## 2018-03-19 DIAGNOSIS — R072 Precordial pain: Secondary | ICD-10-CM | POA: Diagnosis not present

## 2018-03-19 DIAGNOSIS — Z7982 Long term (current) use of aspirin: Secondary | ICD-10-CM | POA: Insufficient documentation

## 2018-03-19 DIAGNOSIS — R0602 Shortness of breath: Secondary | ICD-10-CM | POA: Diagnosis not present

## 2018-03-19 DIAGNOSIS — I10 Essential (primary) hypertension: Secondary | ICD-10-CM | POA: Diagnosis not present

## 2018-03-19 DIAGNOSIS — E440 Moderate protein-calorie malnutrition: Secondary | ICD-10-CM

## 2018-03-19 DIAGNOSIS — Z955 Presence of coronary angioplasty implant and graft: Secondary | ICD-10-CM | POA: Insufficient documentation

## 2018-03-19 DIAGNOSIS — Z7902 Long term (current) use of antithrombotics/antiplatelets: Secondary | ICD-10-CM | POA: Diagnosis not present

## 2018-03-19 DIAGNOSIS — R079 Chest pain, unspecified: Principal | ICD-10-CM | POA: Diagnosis present

## 2018-03-19 DIAGNOSIS — Z79899 Other long term (current) drug therapy: Secondary | ICD-10-CM | POA: Diagnosis not present

## 2018-03-19 DIAGNOSIS — I350 Nonrheumatic aortic (valve) stenosis: Secondary | ICD-10-CM

## 2018-03-19 LAB — COMPREHENSIVE METABOLIC PANEL
ALT: 13 U/L — ABNORMAL LOW (ref 14–54)
ANION GAP: 9 (ref 5–15)
AST: 22 U/L (ref 15–41)
Albumin: 3.8 g/dL (ref 3.5–5.0)
Alkaline Phosphatase: 91 U/L (ref 38–126)
BUN: 8 mg/dL (ref 6–20)
CHLORIDE: 109 mmol/L (ref 101–111)
CO2: 23 mmol/L (ref 22–32)
Calcium: 9.1 mg/dL (ref 8.9–10.3)
Creatinine, Ser: 0.8 mg/dL (ref 0.44–1.00)
GFR calc Af Amer: >60 mL/min (ref >60)
Glucose, Bld: 161 mg/dL — ABNORMAL HIGH (ref 65–99)
POTASSIUM: 3.4 mmol/L — AB (ref 3.5–5.1)
Sodium: 141 mmol/L (ref 135–145)
Total Bilirubin: 0.4 mg/dL (ref 0.3–1.2)
Total Protein: 6.9 g/dL (ref 6.5–8.1)

## 2018-03-19 LAB — I-STAT TROPONIN, ED: TROPONIN I, POC: 0 ng/mL (ref 0.00–0.08)

## 2018-03-19 LAB — CBC
HCT: 41.4 % (ref 36.0–46.0)
HEMOGLOBIN: 12.8 g/dL (ref 12.0–15.0)
MCH: 24.9 pg — ABNORMAL LOW (ref 26.0–34.0)
MCHC: 30.9 g/dL (ref 30.0–36.0)
MCV: 80.4 fL (ref 78.0–100.0)
Platelets: 220 10*3/uL (ref 150–400)
RBC: 5.15 MIL/uL — ABNORMAL HIGH (ref 3.87–5.11)
RDW: 15.4 % (ref 11.5–15.5)
WBC: 5.9 10*3/uL (ref 4.0–10.5)

## 2018-03-19 NOTE — ED Notes (Signed)
Unsuccessful IV attempt x2.  

## 2018-03-19 NOTE — ED Notes (Signed)
Family states they feel as though pt isn't getting enough attention.  States results aren't being reported and chest pain is not relieved.  Dr. Francia Greaves made aware.

## 2018-03-19 NOTE — ED Triage Notes (Signed)
Pt in c/o chest pain that started on Sunday, worse today, pt reports nausea and shortness of breath, no distress noted

## 2018-03-19 NOTE — ED Provider Notes (Signed)
Patient placed in Quick Look pathway, seen and evaluated   Chief Complaint: chest pain.  HPI:   Pt reports chest pain and shortness of breath started today  ROS: no fever, no cough   Physical Exam:   Gen: No distress  Neuro: Awake and Alert  Skin: Warm    Focused Exam: Lungs clear, Heart rrr   Initiation of care has begun. The patient has been counseled on the process, plan, and necessity for staying for the completion/evaluation, and the remainder of the medical screening examination   Sidney Ace 03/19/18 1749    Valarie Merino, MD 03/19/18 [phone]

## 2018-03-19 NOTE — ED Notes (Signed)
IV team at bedside 

## 2018-03-19 NOTE — ED Notes (Signed)
Pt ambulated to bathroom 

## 2018-03-19 NOTE — ED Provider Notes (Signed)
[facility] EMERGENCY DEPARTMENT Provider Note   CSN: [id number] Arrival date & time: 03/19/18  1733     History   Chief Complaint Chief Complaint  Patient presents with  . Chest Pain    HPI Ann Patel is a 72 y.o. female.  72 year old female with prior history of CAD, hypertension, and MI who presents with complaint of chest pressure and pain.  Patient reports symptoms started yesterday.  Symptoms worsened today after improving somewhat last night.  She is now pain-free.  States she reports substernal chest pressure and discomfort.  This is unusual for her.  She does report a prior MI sometime around 2004.  She denies any recent cardiac work-up.  The history is provided by the patient.  Chest Pain   This is a new problem. The current episode started 2 days ago. The problem occurs rarely. The problem has been resolved. The pain is present in the substernal region. The pain is mild. The quality of the pain is described as pressure-like. The pain does not radiate. Pertinent negatives include no abdominal pain and no back pain. She has tried nothing for the symptoms.    Past Medical History:  Diagnosis Date  . Coronary artery disease 09/2009   stress test EF 67%  . Hypercholesteremia   . Hypertension   . Myocardial infarction [facility])     Patient Active Problem List   Diagnosis Date Noted  . Left carotid bruit 03/15/2013  . Aortic valve stenosis, mild 03/15/2013  . Essential hypertension   . Hypercholesteremia   . Coronary artery disease 09/02/2009    Past Surgical History:  Procedure Laterality Date  . BREAST CYST EXCISION     fibro cyst removal  . CORONARY ANGIOPLASTY WITH STENT PLACEMENT Right 07/2005   receiving two consecutive drug eluting 2.5x55mm Taxus stents in the proximal to mid right coronary artery  . TONSILLECTOMY       OB History   None      Home Medications    Prior to Admission medications   Medication Sig Start Date End  Date Taking? Authorizing Provider  acetaminophen (TYLENOL) 650 MG CR tablet Take 650 mg by mouth daily as needed (arthritis pain).   Yes [provider]  amLODipine (NORVASC) 10 MG tablet Take 1 tablet (10 mg total) daily by mouth. Patient taking differently: Take 10 mg by mouth daily at 12 noon.  08/07/17  Yes Croitoru, Mihai, MD  aspirin EC 81 MG tablet Take 81 mg by mouth daily at 12 noon.   Yes [provider]  atorvastatin (LIPITOR) 20 MG tablet Take 1 tablet (20 mg total) daily at 6 PM by mouth. Patient taking differently: Take 20 mg by mouth daily at 12 noon.  08/07/17  Yes Croitoru, Mihai, MD  clopidogrel (PLAVIX) 75 MG tablet Take 1 tablet (75 mg total) daily by mouth. Patient taking differently: Take 75 mg by mouth daily at 12 noon.  08/07/17  Yes Croitoru, Dani Gobble, MD  Liniments (SALONPAS PAIN RELIEF PATCH EX) Place 1 patch onto the skin See admin instructions. Apply one patch topically to right shoulder every day except Sunday   Yes [provider]  lisinopril (PRINIVIL,ZESTRIL) 20 MG tablet Take 1 tablet (20 mg total) daily by mouth. Patient taking differently: Take 20 mg by mouth daily at 12 noon.  08/07/17  Yes Croitoru, Mihai, MD    Family History Family History  Problem Relation Age of Onset  . Stroke Mother 47  deceased  . Cancer Sister 74       deceased    Social History Social History   Tobacco Use  . Smoking status: Never Smoker  . Smokeless tobacco: Never Used  Substance Use Topics  . Alcohol use: No  . Drug use: No     Allergies   Patient has no known allergies.   Review of Systems Review of Systems  Cardiovascular: Positive for chest pain.  Gastrointestinal: Negative for abdominal pain.  Musculoskeletal: Negative for back pain.  All other systems reviewed and are negative.    Physical Exam Updated Vital Signs BP 133/61   Pulse 76   Temp 98 F (36.7 C) (Oral)   Resp 16   SpO2 100%   Physical Exam    Constitutional: She is oriented to person, place, and time. She appears well-developed and well-nourished. No distress.  HENT:  Head: Normocephalic and atraumatic.  Mouth/Throat: Oropharynx is clear and moist.  Eyes: Pupils are equal, round, and reactive to light. Conjunctivae and EOM are normal.  Neck: Normal range of motion. Neck supple.  Cardiovascular: Normal rate, regular rhythm and normal heart sounds.  Pulmonary/Chest: Effort normal and breath sounds normal. No respiratory distress.  Abdominal: Soft. She exhibits no distension. There is no tenderness.  Musculoskeletal: Normal range of motion. She exhibits no edema or deformity.  Neurological: She is alert and oriented to person, place, and time.  Skin: Skin is warm and dry.  Psychiatric: She has a normal mood and affect.  Nursing note and vitals reviewed.    ED Treatments / Results  Labs (all labs ordered are listed, but only abnormal results are displayed) Labs Reviewed  CBC - Abnormal; Notable for the following components:      Result Value   RBC 5.15 (*)    MCH 24.9 (*)    All other components within normal limits  COMPREHENSIVE METABOLIC PANEL - Abnormal; Notable for the following components:   Potassium 3.4 (*)    Glucose, Bld 161 (*)    ALT 13 (*)    All other components within normal limits  TROPONIN I  I-STAT TROPONIN, ED    EKG EKG Interpretation  Date/Time:  Monday March 19 2018 17:40:44 EDT Ventricular Rate:  75 PR Interval:  162 QRS Duration: 92 QT Interval:  372 QTC Calculation: 415 R Axis:   65 Text Interpretation:  Normal sinus rhythm Normal ECG Confirmed by Dene Gentry [phone]) on 03/19/2018 8:15:51 PM   Radiology Dg Chest 2 View  Result Date: 03/19/2018 CLINICAL DATA:  72 year old female with progressive chest pain since yesterday. Nausea and shortness of breath. EXAM: CHEST - 2 VIEW COMPARISON:  [facility] chest radiographs 07/13/2005. FINDINGS: Chronic elevation of the right  hemidiaphragm has only mildly progressed since 2006. Normal lung volumes otherwise. Calcified aortic atherosclerosis. Mediastinal contours remain within normal limits. No pneumothorax, pulmonary edema, pleural effusion or confluent pulmonary opacity. No acute osseous abnormality identified. Negative visible bowel gas pattern. IMPRESSION: 1.  No acute cardiopulmonary abnormality. 2.  Aortic Atherosclerosis (ICD10-I70.0). Electronically Signed   By: Genevie Ann M.D.   On: 03/19/2018 18:13    Procedures Procedures (including critical care time)  Medications Ordered in ED Medications - No data to display   Initial Impression / Assessment and Plan / ED Course  I have reviewed the triage vital signs and the nursing notes.  Pertinent labs & imaging results that were available during my care of the patient were reviewed by me and considered in  my medical decision making (see chart for details).     MDM  Screen complete  Patient is presenting for evaluation of chest pain.  Initial EKG and troponin are reassuring without evidence of acute ischemia.  Her symptoms have improved prior to the evaluation in the ED.  However, patient has had some time since her last cardiac work-up.  Patient will be admitted for further work-up and evaluation.  Hospitalist service [facility]) is aware of case.    Final Clinical Impressions(s) / ED Diagnoses   Final diagnoses:  Chest pain, unspecified type    ED Discharge Orders    None       Valarie Merino, MD 03/19/18 2218

## 2018-03-20 ENCOUNTER — Ambulatory Visit (HOSPITAL_COMMUNITY): Payer: Medicare HMO

## 2018-03-20 ENCOUNTER — Other Ambulatory Visit: Payer: Self-pay

## 2018-03-20 DIAGNOSIS — R079 Chest pain, unspecified: Secondary | ICD-10-CM

## 2018-03-20 DIAGNOSIS — I350 Nonrheumatic aortic (valve) stenosis: Secondary | ICD-10-CM | POA: Diagnosis not present

## 2018-03-20 DIAGNOSIS — I2510 Atherosclerotic heart disease of native coronary artery without angina pectoris: Secondary | ICD-10-CM | POA: Diagnosis not present

## 2018-03-20 DIAGNOSIS — K219 Gastro-esophageal reflux disease without esophagitis: Secondary | ICD-10-CM | POA: Diagnosis not present

## 2018-03-20 DIAGNOSIS — I10 Essential (primary) hypertension: Secondary | ICD-10-CM

## 2018-03-20 DIAGNOSIS — E440 Moderate protein-calorie malnutrition: Secondary | ICD-10-CM

## 2018-03-20 LAB — MRSA PCR SCREENING: MRSA BY PCR: NEGATIVE

## 2018-03-20 LAB — D-DIMER, QUANTITATIVE: D-Dimer, Quant: 0.51 ug/mL-FEU — ABNORMAL HIGH (ref 0.00–0.50)

## 2018-03-20 LAB — TROPONIN I
Troponin I: <0.03 ng/mL (ref <0.03)
Troponin I: <0.03 ng/mL (ref <0.03)

## 2018-03-20 MED ORDER — POLYETHYLENE GLYCOL 3350 17 G PO PACK: 17.0000 g | PACK | Freq: Every day | ORAL | Status: DC | PRN

## 2018-03-20 MED ORDER — ASPIRIN EC 81 MG PO TBEC
81.0000 mg | DELAYED_RELEASE_TABLET | Freq: Every day | ORAL | Status: DC
  Administered 2018-03-20: 81 mg via ORAL
  Filled 2018-03-20: qty 1

## 2018-03-20 MED ORDER — LISINOPRIL 20 MG PO TABS
20.0000 mg | ORAL_TABLET | Freq: Every day | ORAL | Status: DC
  Administered 2018-03-20: 20 mg via ORAL
  Filled 2018-03-20: qty 1

## 2018-03-20 MED ORDER — ENSURE ENLIVE PO LIQD: 237.0000 mL | Freq: Two times a day (BID) | ORAL | 12 refills | Status: AC

## 2018-03-20 MED ORDER — AMLODIPINE BESYLATE 10 MG PO TABS
10.0000 mg | ORAL_TABLET | Freq: Every day | ORAL | Status: DC
  Administered 2018-03-20: 10 mg via ORAL
  Filled 2018-03-20: qty 1

## 2018-03-20 MED ORDER — ONDANSETRON HCL 4 MG PO TABS: 4.0000 mg | ORAL_TABLET | Freq: Four times a day (QID) | ORAL | Status: DC | PRN

## 2018-03-20 MED ORDER — CLOPIDOGREL BISULFATE 75 MG PO TABS
75.0000 mg | ORAL_TABLET | Freq: Every day | ORAL | Status: DC
  Administered 2018-03-20: 75 mg via ORAL
  Filled 2018-03-20: qty 1

## 2018-03-20 MED ORDER — PANTOPRAZOLE SODIUM 40 MG PO TBEC: 40.0000 mg | DELAYED_RELEASE_TABLET | Freq: Every day | ORAL | 1 refills | Status: DC

## 2018-03-20 MED ORDER — GI COCKTAIL ~~LOC~~
30.0000 mL | Freq: Once | ORAL | Status: AC
  Administered 2018-03-20: 30 mL via ORAL
  Filled 2018-03-20: qty 30

## 2018-03-20 MED ORDER — ACETAMINOPHEN 650 MG RE SUPP: 650.0000 mg | Freq: Four times a day (QID) | RECTAL | Status: DC | PRN

## 2018-03-20 MED ORDER — ONDANSETRON HCL 4 MG/2ML IJ SOLN: 4.0000 mg | Freq: Four times a day (QID) | INTRAMUSCULAR | Status: DC | PRN

## 2018-03-20 MED ORDER — ACETAMINOPHEN 325 MG PO TABS: 650.0000 mg | ORAL_TABLET | Freq: Four times a day (QID) | ORAL | Status: DC | PRN

## 2018-03-20 MED ORDER — FAMOTIDINE 20 MG PO TABS: 20.0000 mg | ORAL_TABLET | Freq: Two times a day (BID) | ORAL | Status: DC

## 2018-03-20 MED ORDER — ATORVASTATIN CALCIUM 20 MG PO TABS: 20.0000 mg | ORAL_TABLET | Freq: Every day | ORAL | Status: DC

## 2018-03-20 MED ORDER — ENOXAPARIN SODIUM 40 MG/0.4ML ~~LOC~~ SOLN: 40.0000 mg | SUBCUTANEOUS | Status: DC

## 2018-03-20 MED ORDER — NITROGLYCERIN 0.4 MG SL SUBL: 0.4000 mg | SUBLINGUAL_TABLET | SUBLINGUAL | Status: DC | PRN

## 2018-03-20 MED ORDER — ENSURE ENLIVE PO LIQD: 237.0000 mL | Freq: Two times a day (BID) | ORAL | Status: DC

## 2018-03-20 MED ORDER — PNEUMOCOCCAL VAC POLYVALENT 25 MCG/0.5ML IJ INJ
0.5000 mL | INJECTION | INTRAMUSCULAR | Status: DC
Start: 2018-03-21 — End: 2018-03-20

## 2018-03-20 MED ORDER — FAMOTIDINE IN NACL 20-0.9 MG/50ML-% IV SOLN
20.0000 mg | Freq: Two times a day (BID) | INTRAVENOUS | Status: DC
  Administered 2018-03-20 (×2): 20 mg via INTRAVENOUS
  Filled 2018-03-20 (×2): qty 50

## 2018-03-20 NOTE — ED Notes (Signed)
Attempted report x 1; name and call back number provided 
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
## 2018-03-20 NOTE — Progress Notes (Signed)
Initial Nutrition Assessment  DOCUMENTATION CODES:   Non-severe (moderate) malnutrition in context of chronic illness  INTERVENTION:    Ensure Enlive po BID, each supplement provides 350 kcal and 20 grams of protein  NUTRITION DIAGNOSIS:   Moderate Malnutrition related to chronic illness(CAD) as evidenced by mild fat depletion, mild muscle depletion, moderate muscle depletion.  GOAL:   Patient will meet greater than or equal to 90% of their needs  MONITOR:   PO intake, Supplement acceptance  REASON FOR ASSESSMENT:   Malnutrition Screening Tool    ASSESSMENT:   72 yo female with PMH of CAD, MI, HTN, and HLD who was admitted on 6/17 with chest pain. S/P EKG, MI was ruled out.  Patient reports that she has been eating poorly recently and that she lost a lot of weight recently. C/O poor appetite. She states she has never tried Ensure, but is willing to try it in the hospital. Her sister is at bedside and reports that patient usually eats salads, but sometimes eats french fries.   Labs and medications reviewed.  Potassium 3.4 (L), glucose 161 (H)  NUTRITION - FOCUSED PHYSICAL EXAM:    Most Recent Value  Orbital Region  No depletion  Upper Arm Region  Mild depletion  Thoracic and Lumbar Region  Mild depletion  Buccal Region  No depletion  Temple Region  Moderate depletion  Clavicle Bone Region  Moderate depletion  Clavicle and Acromion Bone Region  Mild depletion  Scapular Bone Region  Mild depletion  Dorsal Hand  Moderate depletion  Patellar Region  Mild depletion  Anterior Thigh Region  Unable to assess  Posterior Calf Region  Mild depletion  Edema (RD Assessment)  None  Hair  Reviewed  Eyes  Reviewed  Mouth  Reviewed  Skin  Reviewed  Nails  Reviewed       Diet Order:   Diet Order           Diet Heart Room service appropriate? Yes; Fluid consistency: Thin  Diet effective now        Diet - low sodium heart healthy          EDUCATION NEEDS:   No  education needs have been identified at this time  Skin:  Skin Assessment: Reviewed RN Assessment  Last BM:  unknown  Height:   Ht Readings from Last 1 Encounters:  03/20/18 4\' 11"  (1.499 m)    Weight:   Wt Readings from Last 1 Encounters:  03/20/18 110 lb 6.4 oz (50.1 kg)    Ideal Body Weight:  44.5 kg  BMI:  Body mass index is 22.3 kg/m.  Estimated Nutritional Needs:   Kcal:  1400-1600  Protein:  65-75 gm  Fluid:  1.5 L    Molli Barrows, RD, LDN, CNSC Pager [phone] After Hours Pager [phone]

## 2018-03-20 NOTE — Discharge Instructions (Addendum)
Chest Wall Pain Chest wall pain is pain in or around the bones and muscles of your chest. Sometimes, an injury causes this pain. Sometimes, the cause may not be known. This pain may take several weeks or longer to get better. Follow these instructions at home: Pay attention to any changes in your symptoms. Take these actions to help with your pain:  Rest as told by your doctor.  Avoid activities that cause pain. Try not to use your chest, belly (abdominal), or side muscles to lift heavy things.  If directed, apply ice to the painful area: ? Put ice in a plastic bag. ? Place a towel between your skin and the bag. ? Leave the ice on for 20 minutes, 2-3 times per day.  Take over-the-counter and prescription medicines only as told by your doctor.  Do not use tobacco products, including cigarettes, chewing tobacco, and e-cigarettes. If you need help quitting, ask your doctor.  Keep all follow-up visits as told by your doctor. This is important.  Contact a doctor if:  You have a fever.  Your chest pain gets worse.  You have new symptoms. Get help right away if:  You feel sick to your stomach (nauseous) or you throw up (vomit).  You feel sweaty or light-headed.  You have a cough with phlegm (sputum) or you cough up blood.  You are short of breath. This information is not intended to replace advice given to you by your health care provider. Make sure you discuss any questions you have with your health care provider. Document Released: 03/07/2008 Document Revised: 02/25/2016 Document Reviewed: 12/15/2014 Elsevier Interactive Patient Education  2018 [place] Heart-healthy meal planning includes:  Limiting unhealthy fats.  Increasing healthy fats.  Making other small dietary changes.  You may need to talk with your doctor or a diet specialist (dietitian) to create an eating plan that is right for you. What types of fat should I  choose?  Choose healthy fats. These include olive oil and canola oil, flaxseeds, walnuts, almonds, and seeds.  Eat more omega-3 fats. These include salmon, mackerel, sardines, tuna, flaxseed oil, and ground flaxseeds. Try to eat fish at least twice each week.  Limit saturated fats. ? Saturated fats are often found in animal products, such as meats, butter, and cream. ? Plant sources of saturated fats include palm oil, palm kernel oil, and coconut oil.  Avoid foods with partially hydrogenated oils in them. These include stick margarine, some tub margarines, cookies, crackers, and other baked goods. These contain trans fats. What general guidelines do I need to follow?  Check food labels carefully. Identify foods with trans fats or high amounts of saturated fat.  Fill one half of your plate with vegetables and green salads. Eat 4-5 servings of vegetables per day. A serving of vegetables is: ? 1 cup of raw leafy vegetables. ?  cup of raw or cooked cut-up vegetables. ?  cup of vegetable juice.  Fill one fourth of your plate with whole grains. Look for the word "whole" as the first word in the ingredient list.  Fill one fourth of your plate with lean protein foods.  Eat 4-5 servings of fruit per day. A serving of fruit is: ? One medium whole fruit. ?  cup of dried fruit. ?  cup of fresh, frozen, or canned fruit. ?  cup of 100% fruit juice.  Eat more foods that contain soluble fiber. These include apples, broccoli, carrots, beans, peas,  and barley. Try to get 20-30 g of fiber per day.  Eat more home-cooked food. Eat less restaurant, buffet, and fast food.  Limit or avoid alcohol.  Limit foods high in starch and sugar.  Avoid fried foods.  Avoid frying your food. Try baking, boiling, grilling, or broiling it instead. You can also reduce fat by: ? Removing the skin from poultry. ? Removing all visible fats from meats. ? Skimming the fat off of stews, soups, and gravies before  serving them. ? Steaming vegetables in water or broth.  Lose weight if you are overweight.  Eat 4-5 servings of nuts, legumes, and seeds per week: ? One serving of dried beans or legumes equals  cup after being cooked. ? One serving of nuts equals 1 ounces. ? One serving of seeds equals  ounce or one tablespoon.  You may need to keep track of how much salt or sodium you eat. This is especially true if you have high blood pressure. Talk with your doctor or dietitian to get more information. What foods can I eat? Grains Breads, including [place], white, pita, wheat, raisin, rye, oatmeal, and [place]. Tortillas that are neither fried nor made with lard or trans fat. Low-fat rolls, including hotdog and hamburger buns and English muffins. Biscuits. Muffins. Waffles. Pancakes. Light popcorn. Whole-grain cereals. Flatbread. Melba toast. Pretzels. Breadsticks. Rusks. Low-fat snacks. Low-fat crackers, including oyster, saltine, matzo, graham, animal, and rye. Rice and pasta, including brown rice and pastas that are made with whole wheat. Vegetables All vegetables. Fruits All fruits, but limit coconut. Meats and Other Protein Sources Lean, well-trimmed beef, veal, pork, and lamb. Chicken and [place] without skin. All fish and shellfish. Wild duck, rabbit, pheasant, and venison. Egg whites or low-cholesterol egg substitutes. Dried beans, peas, lentils, and tofu. Seeds and most nuts. Dairy Low-fat or nonfat cheeses, including ricotta, string, and mozzarella. Skim or 1% milk that is liquid, powdered, or evaporated. Buttermilk that is made with low-fat milk. Nonfat or low-fat yogurt. Beverages Mineral water. Diet carbonated beverages. Sweets and Desserts Sherbets and fruit ices. Honey, jam, marmalade, jelly, and syrups. Meringues and gelatins. Pure sugar candy, such as hard candy, jelly beans, gumdrops, mints, marshmallows, and small amounts of dark chocolate. [employer] Eat all sweets and  desserts in moderation. Fats and Oils Nonhydrogenated (trans-free) margarines. Vegetable oils, including soybean, sesame, sunflower, olive, peanut, safflower, corn, canola, and cottonseed. Salad dressings or mayonnaise made with a vegetable oil. Limit added fats and oils that you use for cooking, baking, salads, and as spreads. Other Cocoa powder. Coffee and tea. All seasonings and condiments. The items listed above may not be a complete list of recommended foods or beverages. Contact your dietitian for more options. What foods are not recommended? Grains Breads that are made with saturated or trans fats, oils, or whole milk. Croissants. Butter rolls. Cheese breads. Sweet rolls. Donuts. Buttered popcorn. Chow mein noodles. High-fat crackers, such as cheese or butter crackers. Meats and Other Protein Sources Fatty meats, such as hotdogs, short ribs, sausage, spareribs, bacon, rib eye roast or steak, and mutton. High-fat deli meats, such as salami and bologna. Caviar. Domestic duck and goose. Organ meats, such as kidney, liver, sweetbreads, and heart. Dairy Cream, sour cream, cream cheese, and creamed cottage cheese. Whole-milk cheeses, including blue (bleu), Monte[place] Jack, [place], [place], American, [place], Swiss, cheddar, [place], and [place]. Whole or 2% milk that is liquid, evaporated, or condensed. Whole buttermilk. Cream sauce or high-fat cheese sauce. Yogurt that is made from whole  milk. Beverages Regular sodas and juice drinks with added sugar. Sweets and Desserts Frosting. Pudding. Cookies. Cakes other than angel food cake. Candy that has milk chocolate or white chocolate, hydrogenated fat, butter, coconut, or unknown ingredients. Buttered syrups. Full-fat ice cream or ice cream drinks. Fats and Oils Gravy that has suet, meat fat, or shortening. Cocoa butter, hydrogenated oils, palm oil, coconut oil, palm kernel oil. These can often be found in baked products, candy, fried foods, nondairy  creamers, and whipped toppings. Solid fats and shortenings, including bacon fat, salt pork, lard, and butter. Nondairy cream substitutes, such as coffee creamers and sour cream substitutes. Salad dressings that are made of unknown oils, cheese, or sour cream. The items listed above may not be a complete list of foods and beverages to avoid. Contact your dietitian for more information. This information is not intended to replace advice given to you by your health care provider. Make sure you discuss any questions you have with your health care provider. Document Released: 03/20/2012 Document Revised: 02/25/2016 Document Reviewed: 03/13/2014 Elsevier Interactive Patient Education  [employer].

## 2018-03-20 NOTE — Progress Notes (Signed)
Discharge instructions (including medications) discussed with and copy provided to patient/caregiver 
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
## 2018-03-20 NOTE — Plan of Care (Signed)
  Problem: Nutrition: Goal: Adequate nutrition will be maintained Outcome: Not Progressing   

## 2018-03-28 ENCOUNTER — Ambulatory Visit: Payer: Medicare HMO | Admitting: Physician Assistant

## 2018-03-28 ENCOUNTER — Encounter: Payer: Self-pay | Admitting: Physician Assistant

## 2018-03-28 VITALS — BP 112/56 | HR 72 | Ht 59.0 in | Wt 115.0 lb

## 2018-03-28 DIAGNOSIS — R079 Chest pain, unspecified: Secondary | ICD-10-CM | POA: Diagnosis not present

## 2018-03-28 DIAGNOSIS — I350 Nonrheumatic aortic (valve) stenosis: Secondary | ICD-10-CM

## 2018-03-28 DIAGNOSIS — I2510 Atherosclerotic heart disease of native coronary artery without angina pectoris: Secondary | ICD-10-CM

## 2018-03-28 MED ORDER — NITROGLYCERIN 0.4 MG SL SUBL: 0.4000 mg | SUBLINGUAL_TABLET | SUBLINGUAL | 4 refills | Status: DC | PRN
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
## 2018-03-28 NOTE — Patient Instructions (Signed)
Medication Instructions: Your physician recommends that you continue on your current medications as directed. Please refer to the Current Medication list given to you today.  Testing/Procedures: Your physician has requested that you have an echocardiogram. Echocardiography is a painless test that uses sound waves to create images of your heart. It provides your doctor with information about the size and shape of your heart and how well your heart's chambers and valves are working. This procedure takes approximately one hour. There are no restrictions for this procedure.  Follow-Up: Make appt with Dr. Sallyanne Kuster for October or November.  Any Other Special Instructions are listed below:  OK to not gain weight, but do NOT lose any weight.

## 2018-04-12 ENCOUNTER — Ambulatory Visit (HOSPITAL_COMMUNITY): Payer: Medicare HMO | Attending: Cardiology

## 2018-04-12 ENCOUNTER — Other Ambulatory Visit: Payer: Self-pay

## 2018-04-12 DIAGNOSIS — E785 Hyperlipidemia, unspecified: Secondary | ICD-10-CM | POA: Insufficient documentation

## 2018-04-12 DIAGNOSIS — I2510 Atherosclerotic heart disease of native coronary artery without angina pectoris: Secondary | ICD-10-CM | POA: Diagnosis not present

## 2018-04-12 DIAGNOSIS — R011 Cardiac murmur, unspecified: Secondary | ICD-10-CM | POA: Insufficient documentation

## 2018-04-12 DIAGNOSIS — I350 Nonrheumatic aortic (valve) stenosis: Secondary | ICD-10-CM

## 2018-04-12 DIAGNOSIS — I272 Pulmonary hypertension, unspecified: Secondary | ICD-10-CM | POA: Insufficient documentation

## 2018-04-12 DIAGNOSIS — I340 Nonrheumatic mitral (valve) insufficiency: Secondary | ICD-10-CM | POA: Diagnosis not present

## 2018-04-12 DIAGNOSIS — R29898 Other symptoms and signs involving the musculoskeletal system: Secondary | ICD-10-CM | POA: Insufficient documentation

## 2018-04-12 DIAGNOSIS — I10 Essential (primary) hypertension: Secondary | ICD-10-CM | POA: Insufficient documentation

## 2018-04-12 DIAGNOSIS — I252 Old myocardial infarction: Secondary | ICD-10-CM | POA: Diagnosis not present

## 2018-04-16 ENCOUNTER — Telehealth: Payer: Self-pay | Admitting: Physician Assistant

## 2018-04-16 NOTE — Telephone Encounter (Signed)
Follow Up:; ° ° °Returning your call. °

## 2018-04-16 NOTE — Telephone Encounter (Signed)
New Message   Pt returning call for Tee about echo results. Please call

## 2018-04-17 DIAGNOSIS — S52502D Unspecified fracture of the lower end of left radius, subsequent encounter for closed fracture with routine healing: Secondary | ICD-10-CM | POA: Diagnosis not present

## 2018-04-17 DIAGNOSIS — S52602D Unspecified fracture of lower end of left ulna, subsequent encounter for closed fracture with routine healing: Secondary | ICD-10-CM | POA: Diagnosis not present

## 2018-04-17 DIAGNOSIS — S82102D Unspecified fracture of upper end of left tibia, subsequent encounter for closed fracture with routine healing: Secondary | ICD-10-CM | POA: Diagnosis not present

## 2018-04-17 DIAGNOSIS — M1712 Unilateral primary osteoarthritis, left knee: Secondary | ICD-10-CM | POA: Diagnosis not present

## 2018-04-17 DIAGNOSIS — M85862 Other specified disorders of bone density and structure, left lower leg: Secondary | ICD-10-CM | POA: Diagnosis not present

## 2018-04-17 DIAGNOSIS — Z967 Presence of other bone and tendon implants: Secondary | ICD-10-CM | POA: Diagnosis not present

## 2018-04-17 DIAGNOSIS — S82192D Other fracture of upper end of left tibia, subsequent encounter for closed fracture with routine healing: Secondary | ICD-10-CM | POA: Diagnosis not present

## 2018-04-17 NOTE — Telephone Encounter (Signed)
Patient given ECHO results and voiced understanding. See test results from 04/16/2018.

## 2018-06-06 ENCOUNTER — Encounter (HOSPITAL_COMMUNITY): Payer: Self-pay

## 2018-06-06 NOTE — Therapy (Signed)
[place], [place], [address] Phone: [phone]   Fax:  [phone]  Patient Details  Name: Ann Patel MRN: [id number] Date of Birth: 07-08-1946 Referring Provider:  No ref. provider found  Encounter Date: 06/06/2018    PHYSICAL THERAPY DISCHARGE SUMMARY  Visits from Start of Care: 13  Current functional level related to goals / functional outcomes: See last PT treatment note   Remaining deficits: See last PT treatment note   Education / Equipment: n/a  Plan: Patient agrees to discharge.  Patient goals were partially met. Patient is being discharged due to not returning since the last visit.  ?????      Geraldine Solar PT, [place] [address] [place], [place], [address] Phone: [phone]   Fax:  [phone]

## 2018-08-16 ENCOUNTER — Other Ambulatory Visit: Payer: Self-pay | Admitting: Cardiovascular Disease

## 2018-08-22 ENCOUNTER — Other Ambulatory Visit: Payer: Self-pay | Admitting: Cardiovascular Disease

## 2018-08-27 ENCOUNTER — Ambulatory Visit: Payer: Medicare HMO | Admitting: Cardiovascular Disease

## 2018-08-28 ENCOUNTER — Other Ambulatory Visit: Payer: Self-pay | Admitting: Cardiovascular Disease

## 2018-09-04 ENCOUNTER — Encounter: Payer: Self-pay | Admitting: Cardiovascular Disease

## 2018-09-04 ENCOUNTER — Ambulatory Visit: Payer: Medicare HMO | Admitting: Cardiovascular Disease

## 2018-09-04 VITALS — BP 130/60 | HR 79 | Ht 60.0 in | Wt 122.6 lb

## 2018-09-04 DIAGNOSIS — E7800 Pure hypercholesterolemia, unspecified: Secondary | ICD-10-CM

## 2018-09-04 DIAGNOSIS — I10 Essential (primary) hypertension: Secondary | ICD-10-CM | POA: Diagnosis not present

## 2018-09-04 DIAGNOSIS — I2510 Atherosclerotic heart disease of native coronary artery without angina pectoris: Secondary | ICD-10-CM | POA: Diagnosis not present

## 2018-09-04 DIAGNOSIS — I350 Nonrheumatic aortic (valve) stenosis: Secondary | ICD-10-CM | POA: Diagnosis not present
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
## 2018-09-04 NOTE — Patient Instructions (Signed)
Medication Instructions:  Dr Croitoru recommends that you continue on your current medications as directed. Please refer to the Current Medication list given to you today.  If you need a refill on your cardiac medications before your next appointment, please call your pharmacy.   Follow-Up: At [facility], you and your health needs are our priority.  As part of our continuing mission to provide you with exceptional heart care, we have created designated Provider Care Teams.  These Care Teams include your primary Cardiologist (physician) and Advanced Practice Providers (APPs -  Physician Assistants and Nurse Practitioners) who all work together to provide you with the care you need, when you need it. You will need a follow up appointment in 12 months.  Please call our office 2 months in advance to schedule this appointment.  You may see Mihai Croitoru, MD or one of the following Advanced Practice Providers on your designated Care Team: Hao Meng, PA-C . Angela Duke, PA-C 

## 2018-09-17 ENCOUNTER — Other Ambulatory Visit: Payer: Self-pay | Admitting: Cardiovascular Disease

## 2018-12-05 DIAGNOSIS — I2510 Atherosclerotic heart disease of native coronary artery without angina pectoris: Secondary | ICD-10-CM | POA: Diagnosis not present

## 2018-12-05 DIAGNOSIS — Z1389 Encounter for screening for other disorder: Secondary | ICD-10-CM | POA: Diagnosis not present

## 2018-12-05 DIAGNOSIS — Z0001 Encounter for general adult medical examination with abnormal findings: Secondary | ICD-10-CM | POA: Diagnosis not present

## 2018-12-05 DIAGNOSIS — E7849 Other hyperlipidemia: Secondary | ICD-10-CM | POA: Diagnosis not present

## 2018-12-05 DIAGNOSIS — M1991 Primary osteoarthritis, unspecified site: Secondary | ICD-10-CM | POA: Diagnosis not present

## 2018-12-05 DIAGNOSIS — Z681 Body mass index (BMI) 19 or less, adult: Secondary | ICD-10-CM | POA: Diagnosis not present

## 2018-12-05 DIAGNOSIS — E538 Deficiency of other specified B group vitamins: Secondary | ICD-10-CM | POA: Diagnosis not present

## 2018-12-05 DIAGNOSIS — I071 Rheumatic tricuspid insufficiency: Secondary | ICD-10-CM | POA: Diagnosis not present

## 2018-12-05 DIAGNOSIS — E042 Nontoxic multinodular goiter: Secondary | ICD-10-CM | POA: Diagnosis not present

## 2018-12-26 ENCOUNTER — Encounter: Payer: Self-pay | Admitting: *Deleted

## 2019-01-02 IMAGING — DX DG CHEST 2V
2 series · 2 of 2 positions shown · non-contrast
Comparison: [HOSPITAL] chest radiographs 07/13/2005.

CLINICAL DATA: 72-year-old female with progressive chest pain since
yesterday. Nausea and shortness of breath.

EXAM:
CHEST - 2 VIEW

[w chest pa]
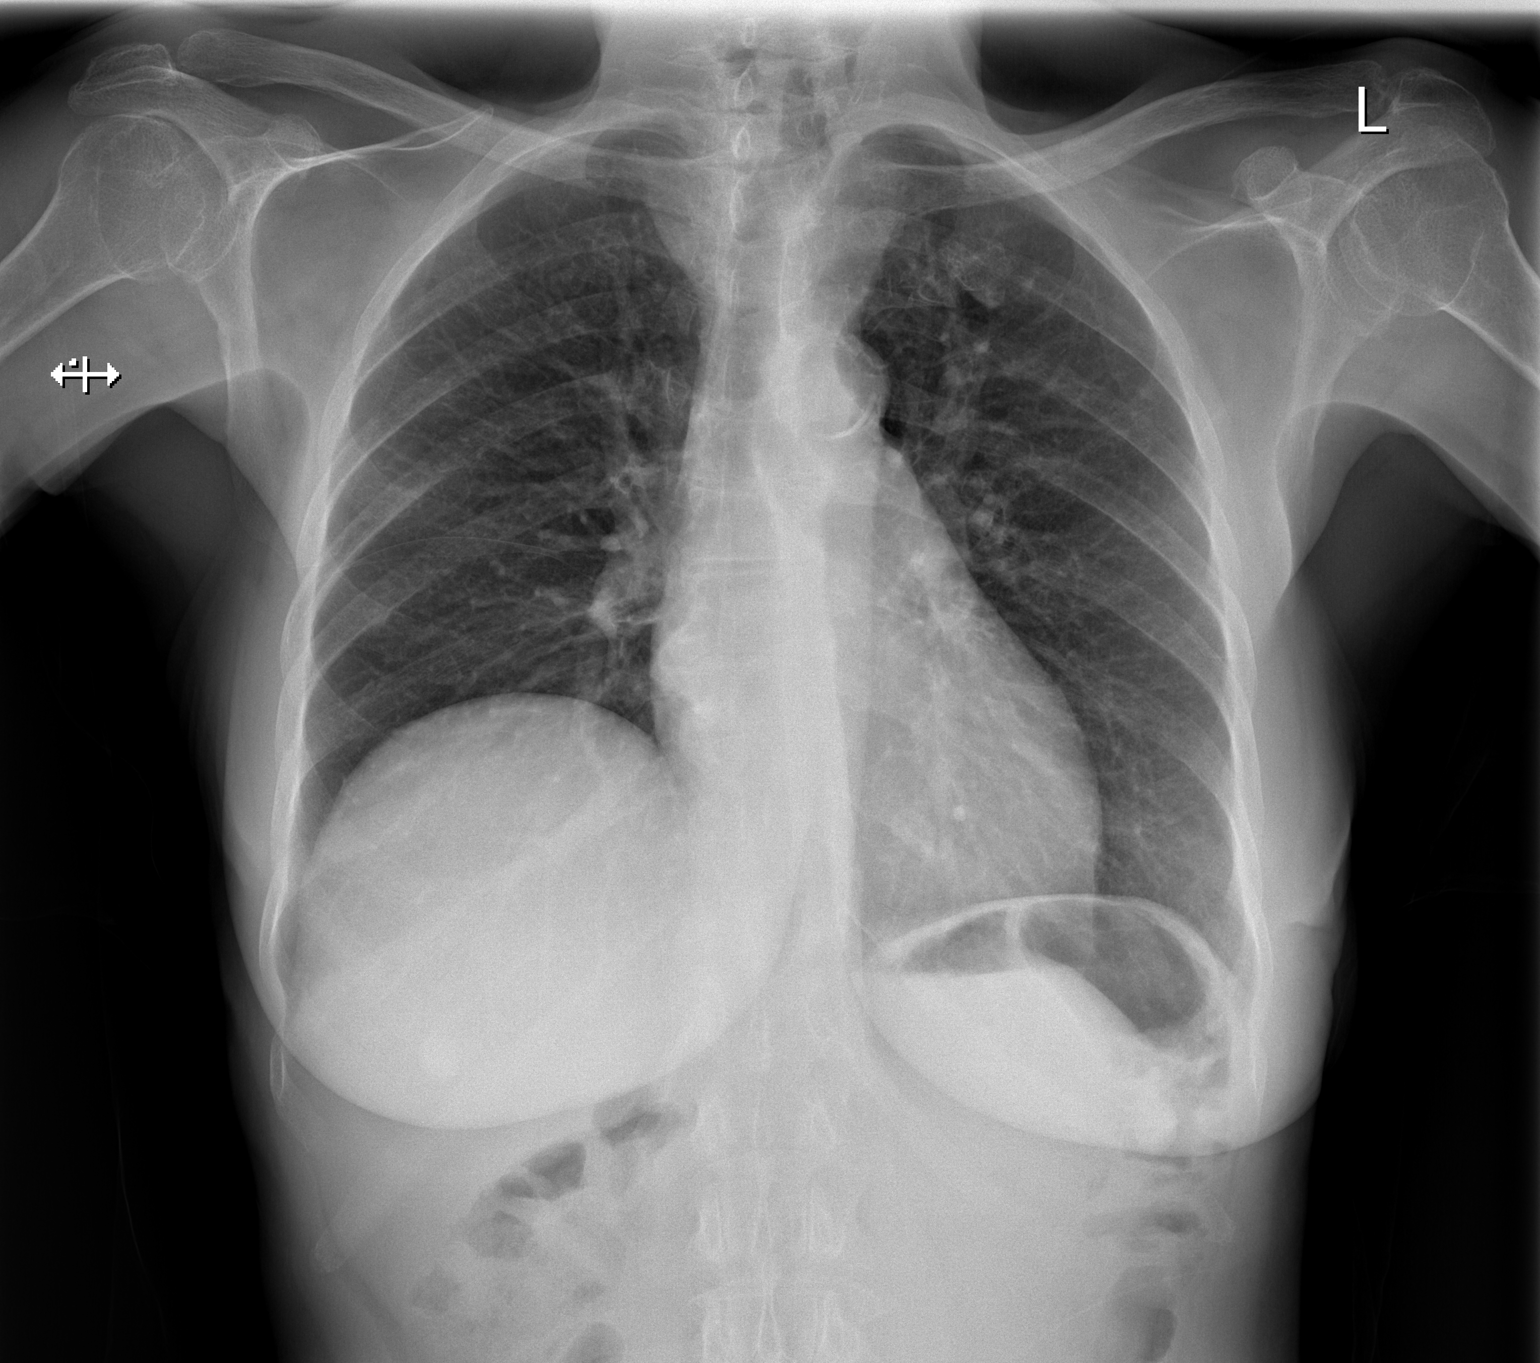

[w chest lat]
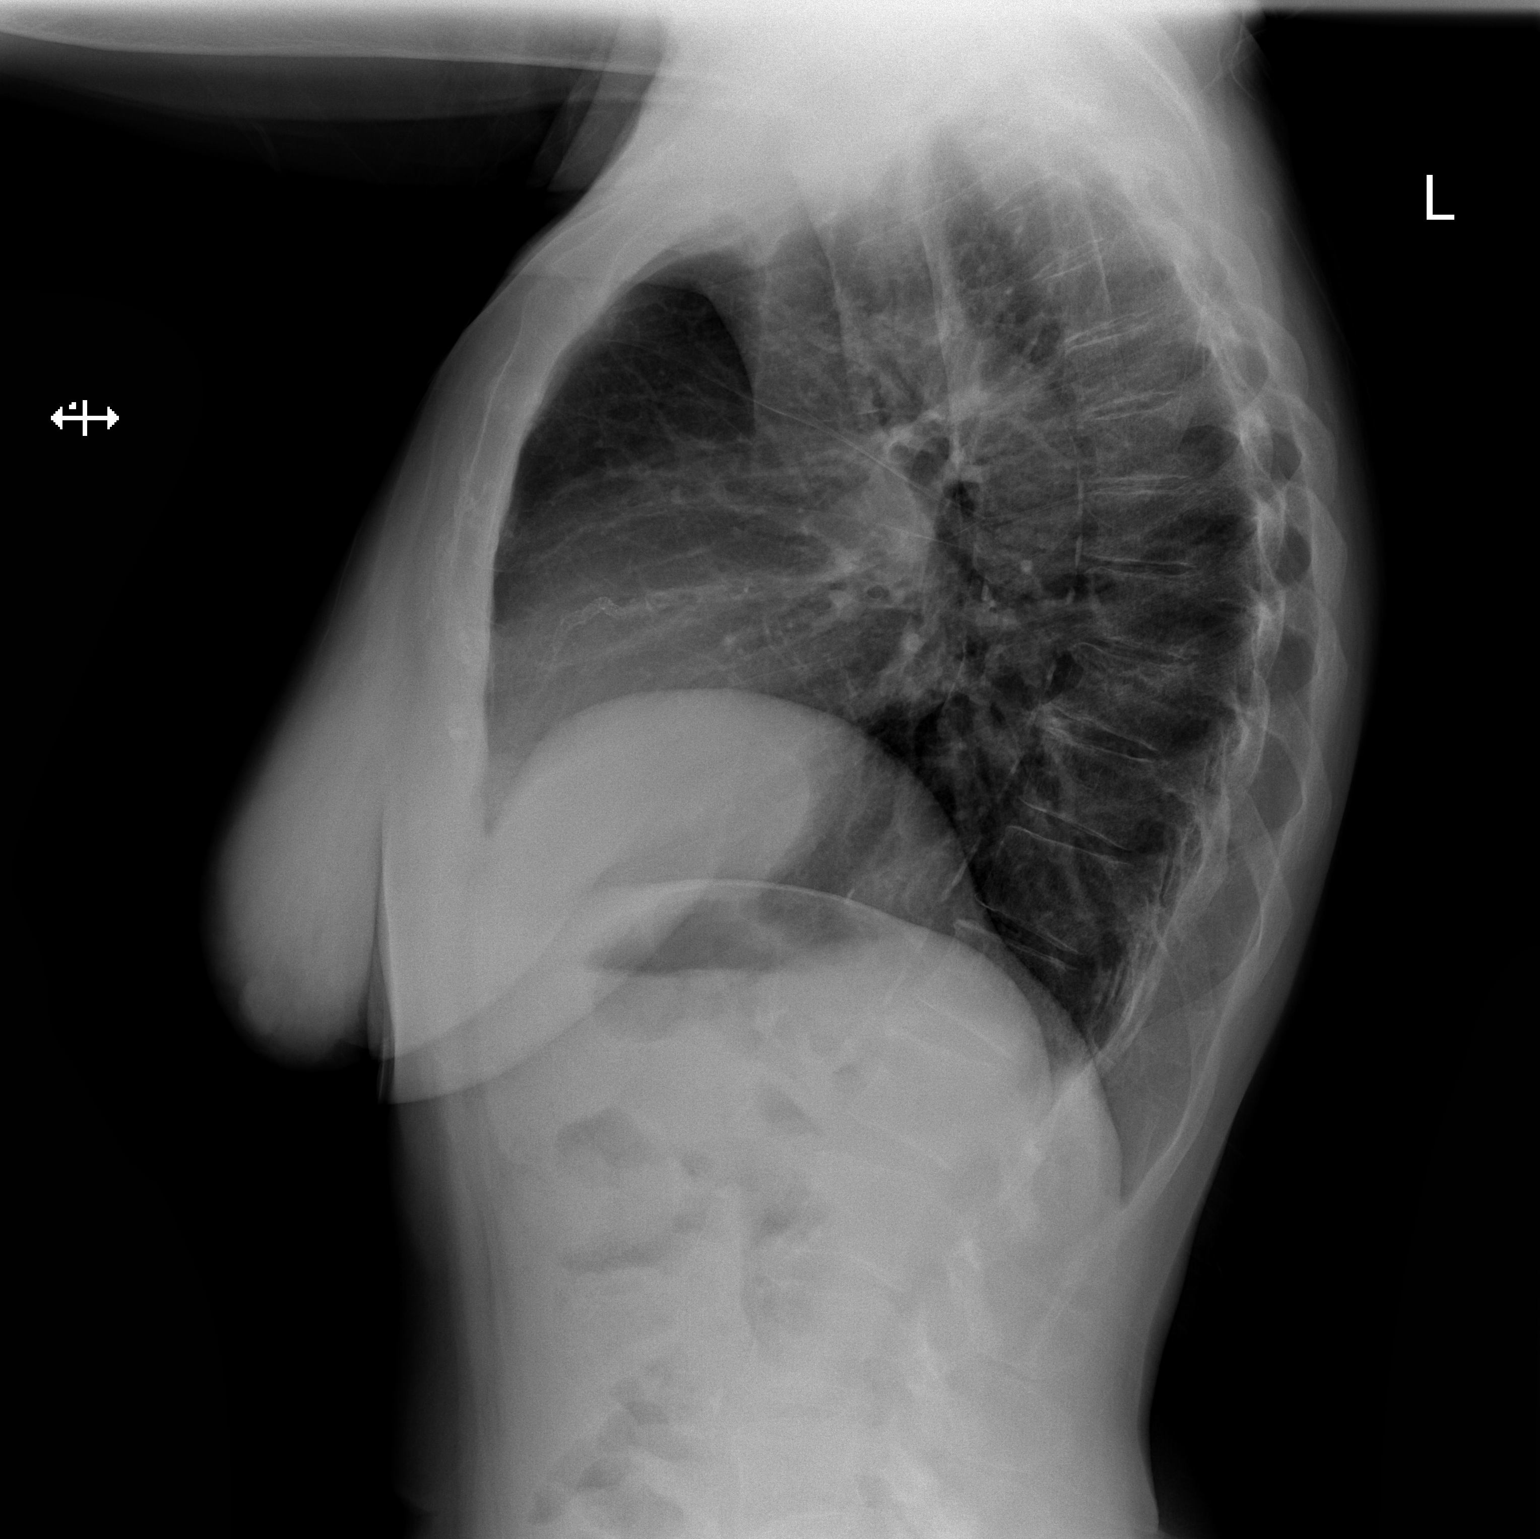

[2 of 2 positions shown; findings below may reference images not displayed]

FINDINGS: Chronic elevation of the right hemidiaphragm has only mildly
progressed since 3558. Normal lung volumes otherwise. Calcified
aortic atherosclerosis. Mediastinal contours remain within normal
limits. No pneumothorax, pulmonary edema, pleural effusion or
confluent pulmonary opacity. No acute osseous abnormality
identified. Negative visible bowel gas pattern.
IMPRESSION: 1.  No acute cardiopulmonary abnormality.
2.  Aortic Atherosclerosis ([id number]).

## 2019-02-20 ENCOUNTER — Encounter: Payer: Self-pay | Admitting: Gastroenterology

## 2019-03-05 ENCOUNTER — Ambulatory Visit: Payer: Medicare HMO

## 2019-04-25 ENCOUNTER — Other Ambulatory Visit (HOSPITAL_COMMUNITY): Payer: Self-pay | Admitting: Internal Medicine

## 2019-04-25 DIAGNOSIS — M1712 Unilateral primary osteoarthritis, left knee: Secondary | ICD-10-CM | POA: Diagnosis not present

## 2019-04-25 DIAGNOSIS — E663 Overweight: Secondary | ICD-10-CM | POA: Diagnosis not present

## 2019-04-25 DIAGNOSIS — Y812 Prosthetic and other implants, materials and accessory general- and plastic-surgery devices associated with adverse incidents: Secondary | ICD-10-CM | POA: Diagnosis not present

## 2019-04-25 DIAGNOSIS — Z1389 Encounter for screening for other disorder: Secondary | ICD-10-CM | POA: Diagnosis not present

## 2019-04-25 DIAGNOSIS — Z1231 Encounter for screening mammogram for malignant neoplasm of breast: Secondary | ICD-10-CM

## 2019-04-25 DIAGNOSIS — Z6825 Body mass index (BMI) 25.0-25.9, adult: Secondary | ICD-10-CM | POA: Diagnosis not present

## 2019-05-07 ENCOUNTER — Ambulatory Visit: Payer: Medicare HMO

## 2019-08-07 ENCOUNTER — Other Ambulatory Visit: Payer: Self-pay | Admitting: Physician Assistant

## 2019-08-19 ENCOUNTER — Other Ambulatory Visit: Payer: Self-pay | Admitting: Cardiovascular Disease

## 2019-08-22 ENCOUNTER — Other Ambulatory Visit: Payer: Self-pay | Admitting: Cardiovascular Disease

## 2019-09-09 ENCOUNTER — Other Ambulatory Visit: Payer: Self-pay | Admitting: Cardiovascular Disease

## 2019-09-19 ENCOUNTER — Telehealth: Payer: Medicare HMO | Admitting: Physician Assistant

## 2019-09-20 ENCOUNTER — Encounter: Payer: Self-pay | Admitting: Physician Assistant

## 2019-09-20 ENCOUNTER — Telehealth (INDEPENDENT_AMBULATORY_CARE_PROVIDER_SITE_OTHER): Payer: Medicare HMO | Admitting: Physician Assistant

## 2019-09-20 VITALS — Ht 60.0 in | Wt 132.0 lb

## 2019-09-20 DIAGNOSIS — I10 Essential (primary) hypertension: Secondary | ICD-10-CM

## 2019-09-20 DIAGNOSIS — E785 Hyperlipidemia, unspecified: Secondary | ICD-10-CM | POA: Diagnosis not present

## 2019-09-20 DIAGNOSIS — I2510 Atherosclerotic heart disease of native coronary artery without angina pectoris: Secondary | ICD-10-CM | POA: Diagnosis not present

## 2019-09-20 NOTE — Patient Instructions (Signed)
Medication Instructions:  Your physician recommends that you continue on your current medications as directed. Please refer to the Current Medication list given to you today.  *If you need a refill on your cardiac medications before your next appointment, please call your pharmacy*  Lab Work: None ordered   If you have labs (blood work) drawn today and your tests are completely normal, you will receive your results only by: Marland Kitchen MyChart Message (if you have MyChart) OR . A paper copy in the mail If you have any lab test that is abnormal or we need to change your treatment, we will call you to review the results.  Testing/Procedures: None ordered   Follow-Up: At [facility], you and your health needs are our priority.  As part of our continuing mission to provide you with exceptional heart care, we have created designated Provider Care Teams.  These Care Teams include your primary Cardiologist (physician) and Advanced Practice Providers (APPs -  Physician Assistants and Nurse Practitioners) who all work together to provide you with the care you need, when you need it.  Your next appointment:   12 month(s)  The format for your next appointment:   In Person  Provider:   You may see Sanda Klein, MD or one of the following Advanced Practice Providers on your designated Care Team:    Almyra Deforest, PA-C  Fabian Sharp, [place] or   Roby Lofts, [place]   Other Instructions None

## 2019-09-20 NOTE — Progress Notes (Signed)
Virtual Visit via Telephone Note   This visit type was conducted due to national recommendations for restrictions regarding the COVID-19 Pandemic (e.g. social distancing) in an effort to limit this patient's exposure and mitigate transmission in our community.  Due to her co-morbid illnesses, this patient is at least at moderate risk for complications without adequate follow up.  This format is felt to be most appropriate for this patient at this time.  The patient did not have access to video technology/had technical difficulties with video requiring transitioning to audio format only (telephone).  All issues noted in this document were discussed and addressed.  No physical exam could be performed with this format.  Please refer to the patient's chart for her  consent to telehealth for [facility] [place].   Date:  09/22/2019   ID:  Ann Patel, DOB May 03, 1946, MRN [id number]  Patient Location: Home Provider Location: Office  PCP:  Sharilyn Sites, MD  Cardiologist:  Sanda Klein, MD  Electrophysiologist:  None   Evaluation Performed:  Follow-Up Visit  Chief Complaint:  Annual followup  History of Present Illness:    Ann Patel is a 73 y.o. female with past medical history of CAD, mild aortic stenosis, hypertension, and hyperlipidemia.  Patient had stent to the RCA in 2006.  Myoview in 2010 showed a medium sized mid to basal inferior scar without ischemia, EF was normal.  Echocardiogram obtained on 04/12/2018 showed EF 55 to 60%, grade 1 DD, mild MR, PA peak pressure 39 mmHg.  Patient was contacted via telephone visit.  She denies any recent chest pain, shortness of breath, lower extremity edema, orthopnea or PND.  She is able to walk 3 laps on the track outside her house without any issue.  There was no angina or claudication symptoms.  She can follow-up in 1 year.  She did not have any vital signs today as she does not have any blood pressure cuff at home.  Whenever she goes to  [facility], she does check her blood pressure with the free BP machine there.  Her systolic blood pressure was in the 120s range the last time she checked.  The patient does not have symptoms concerning for COVID-19 infection (fever, chills, cough, or new shortness of breath).    Past Medical History:  Diagnosis Date  . Coronary artery disease 09/2009   stress test EF 67%  . Hypercholesteremia   . Hypertension   . Myocardial infarction [facility])    Past Surgical History:  Procedure Laterality Date  . BREAST CYST EXCISION     fibro cyst removal  . CORONARY ANGIOPLASTY WITH STENT PLACEMENT Right 07/2005   receiving two consecutive drug eluting 2.5x22mm Taxus stents in the proximal to mid right coronary artery  . TONSILLECTOMY       Current Meds  Medication Sig  . acetaminophen (TYLENOL) 650 MG CR tablet Take 650 mg by mouth daily as needed (arthritis pain).  Marland Kitchen amLODipine (NORVASC) 10 MG tablet TAKE ONE TABLET BY MOUTH (10MG  TOTAL) DAILY  . aspirin EC 81 MG tablet Take 81 mg by mouth daily at 12 noon.  Marland Kitchen atorvastatin (LIPITOR) 20 MG tablet TAKE ONE TABLET (20MG  TOTAL) BY MOUTH DAILY AT 6PM  . clopidogrel (PLAVIX) 75 MG tablet TAKE ONE TABLET (75MG  TOTAL) BY MOUTH DAILY  . feeding supplement, ENSURE ENLIVE, (ENSURE ENLIVE) LIQD Take 237 mLs by mouth 2 (two) times daily between meals.  . Liniments (SALONPAS PAIN RELIEF PATCH EX) Place 1 patch onto the skin  See admin instructions. Apply one patch topically to right shoulder every day except Sunday  . lisinopril (ZESTRIL) 20 MG tablet TAKE ONE TABLET (20MG  TOTAL) BY MOUTH DAILY  . pantoprazole (PROTONIX) 40 MG tablet Take 1 tablet (40 mg total) by mouth daily.     Allergies:   Patient has no known allergies.   Social History   Tobacco Use  . Smoking status: Never Smoker  . Smokeless tobacco: Never Used  Substance Use Topics  . Alcohol use: No  . Drug use: No     Family Hx: The patient's family history includes Cancer (age of onset:  84) in her sister; Stroke (age of onset: 110) in her mother.  ROS:   Please see the history of present illness.     All other systems reviewed and are negative.   Prior CV studies:   The following studies were reviewed today:  Echo 04/12/2018 LV EF: 55% -   60% Study Conclusions  - Left ventricle: The cavity size was normal. Wall thickness was   increased in a pattern of mild LVH. Systolic function was normal.   The estimated ejection fraction was in the range of 55% to 60%.   There is akinesis of the inferolateral myocardium. Doppler   parameters are consistent with abnormal left ventricular   relaxation (grade 1 diastolic dysfunction). - Mitral valve: There was mild regurgitation. - Pulmonary arteries: Systolic pressure was mildly increased. PA   peak pressure: 39 mm Hg (S).  Impressions:  - Akinesis of the mid inferolateral wall with overall normal LV   function; mild diastolic dysfunction; mild LVH; mildly elevated   LVOT velocity of 2.2 m/s; mild MR; trace TR with mild pulmonary   hypertension.   Labs/Other Tests and Data Reviewed:    EKG:  An ECG dated 09/04/2018 was personally reviewed today and demonstrated:  Normal sinus rhythm without significant ST-T wave changes  Recent Labs: No results found for requested labs within last 8760 hours.   Recent Lipid Panel Lab Results  Component Value Date/Time   CHOL 162 08/09/2017 08:30 AM   TRIG 83 08/09/2017 08:30 AM   HDL 57 08/09/2017 08:30 AM   CHOLHDL 2.8 08/09/2017 08:30 AM   CHOLHDL 2.6 04/29/2014 08:53 AM   LDLCALC 88 08/09/2017 08:30 AM    Wt Readings from Last 3 Encounters:  09/20/19 132 lb (59.9 kg)  09/04/18 122 lb 9.6 oz (55.6 kg)  03/28/18 115 lb (52.2 kg)     Objective:    Vital Signs:  Ht 5' (1.524 m)   Wt 132 lb (59.9 kg)   BMI 25.78 kg/m    VITAL SIGNS:  reviewed  ASSESSMENT & PLAN:    1. CAD: She has not had recurrent issues since previous cardiac catheterization in 2006.  She  remains active without any anginal symptom or increasing dyspnea.  Continue aspirin and Plavix.  2. Hypertension: Blood pressure well controlled on current therapy based on previous blood pressure reading obtained at [facility].  Continue on current therapy  3. Hyperlipidemia: Continue Lipitor.  Lab work followed by PCP.  COVID-19 Education: The signs and symptoms of COVID-19 were discussed with the patient and how to seek care for testing (follow up with PCP or arrange E-visit).  The importance of social distancing was discussed today.  Time:   Today, I have spent 5 minutes with the patient with telehealth technology discussing the above problems.     Medication Adjustments/Labs and Tests Ordered: Current medicines are reviewed at  length with the patient today.  Concerns regarding medicines are outlined above.   Tests Ordered: No orders of the defined types were placed in this encounter.   Medication Changes: No orders of the defined types were placed in this encounter.   Follow Up:  In Person in 1 year(s)  Signed, Almyra Deforest, [place]  09/22/2019 12:04 AM    [place]

## 2019-10-23 ENCOUNTER — Telehealth: Payer: Self-pay | Admitting: Cardiovascular Disease

## 2019-10-23 NOTE — Telephone Encounter (Signed)
We are recommending the COVID-19 vaccine to all of our patients. Cardiac medications (including blood thinners) should not deter anyone from being vaccinated and there is no need to hold any of those medications prior to vaccine administration.     Currently, there is a hotline to call (active 10/11/19) to schedule vaccination appointments as no walk-ins will be accepted.   Number: [phone].    If an appointment is not available please go to [email or web address] to sign up for notification when additional vaccine appointments are available.   If you have further questions or concerns about the vaccine process, please visit [email or web address] or contact your primary care physician.   I read the above information to patient and she said she understood

## 2019-11-08 ENCOUNTER — Other Ambulatory Visit: Payer: Self-pay | Admitting: Physician Assistant

## 2019-12-05 DIAGNOSIS — I2510 Atherosclerotic heart disease of native coronary artery without angina pectoris: Secondary | ICD-10-CM | POA: Diagnosis not present

## 2019-12-05 DIAGNOSIS — Z0001 Encounter for general adult medical examination with abnormal findings: Secondary | ICD-10-CM | POA: Diagnosis not present

## 2019-12-05 DIAGNOSIS — I10 Essential (primary) hypertension: Secondary | ICD-10-CM | POA: Diagnosis not present

## 2019-12-05 DIAGNOSIS — R7309 Other abnormal glucose: Secondary | ICD-10-CM | POA: Diagnosis not present

## 2019-12-05 DIAGNOSIS — I071 Rheumatic tricuspid insufficiency: Secondary | ICD-10-CM | POA: Diagnosis not present

## 2019-12-05 DIAGNOSIS — Z1389 Encounter for screening for other disorder: Secondary | ICD-10-CM | POA: Diagnosis not present

## 2019-12-05 DIAGNOSIS — E538 Deficiency of other specified B group vitamins: Secondary | ICD-10-CM | POA: Diagnosis not present

## 2019-12-10 ENCOUNTER — Other Ambulatory Visit: Payer: Self-pay | Admitting: Cardiovascular Disease

## 2019-12-11 ENCOUNTER — Encounter: Payer: Self-pay | Admitting: *Deleted

## 2019-12-13 ENCOUNTER — Other Ambulatory Visit: Payer: Self-pay

## 2019-12-13 ENCOUNTER — Ambulatory Visit (HOSPITAL_COMMUNITY)
Admission: RE | Admit: 2019-12-13 | Discharge: 2019-12-13 | Disposition: A | Discharge status: Home / Self Care | Payer: Medicare HMO | Source: Ambulatory Visit | Attending: Internal Medicine | Admitting: Internal Medicine

## 2019-12-13 DIAGNOSIS — Z1231 Encounter for screening mammogram for malignant neoplasm of breast: Secondary | ICD-10-CM | POA: Diagnosis not present

## 2020-01-15 DIAGNOSIS — R42 Dizziness and giddiness: Secondary | ICD-10-CM | POA: Diagnosis not present

## 2020-01-15 DIAGNOSIS — E663 Overweight: Secondary | ICD-10-CM | POA: Diagnosis not present

## 2020-01-15 DIAGNOSIS — Z6825 Body mass index (BMI) 25.0-25.9, adult: Secondary | ICD-10-CM | POA: Diagnosis not present

## 2020-01-29 ENCOUNTER — Ambulatory Visit: Payer: Self-pay

## 2020-01-29 ENCOUNTER — Other Ambulatory Visit: Payer: Self-pay

## 2020-03-09 ENCOUNTER — Other Ambulatory Visit: Payer: Self-pay | Admitting: Cardiovascular Disease

## 2020-04-23 ENCOUNTER — Ambulatory Visit: Payer: Self-pay

## 2020-06-09 ENCOUNTER — Other Ambulatory Visit: Payer: Self-pay | Admitting: Cardiovascular Disease

## 2020-08-10 ENCOUNTER — Other Ambulatory Visit: Payer: Self-pay | Admitting: Physician Assistant

## 2020-08-10 NOTE — Telephone Encounter (Signed)
rx refill

## 2020-08-18 ENCOUNTER — Other Ambulatory Visit: Payer: Self-pay | Admitting: Cardiovascular Disease

## 2020-08-22 ENCOUNTER — Other Ambulatory Visit: Payer: Self-pay | Admitting: Cardiovascular Disease

## 2020-09-08 ENCOUNTER — Other Ambulatory Visit: Payer: Self-pay | Admitting: Physician Assistant

## 2020-09-27 IMAGING — MG DIGITAL SCREENING BILAT W/ TOMO W/ CAD
8 series · 8 of 24 positions shown · non-contrast
Comparison: Previous exam(s).

CLINICAL DATA: Screening.

EXAM:
DIGITAL SCREENING BILATERAL MAMMOGRAM WITH TOMO AND CAD

[L MLO synth-2D]
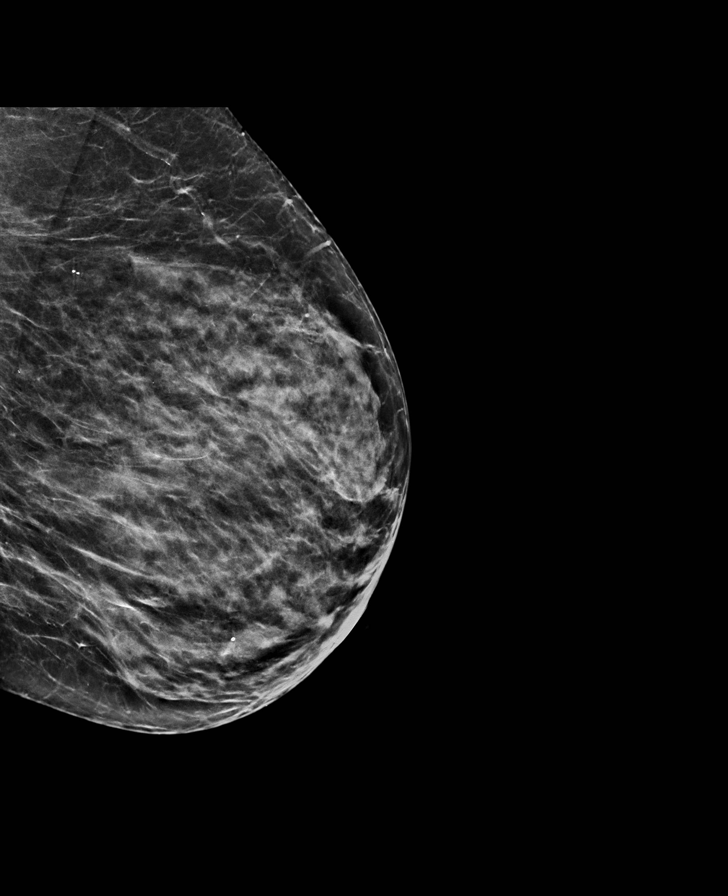

[R CC synth-2D]
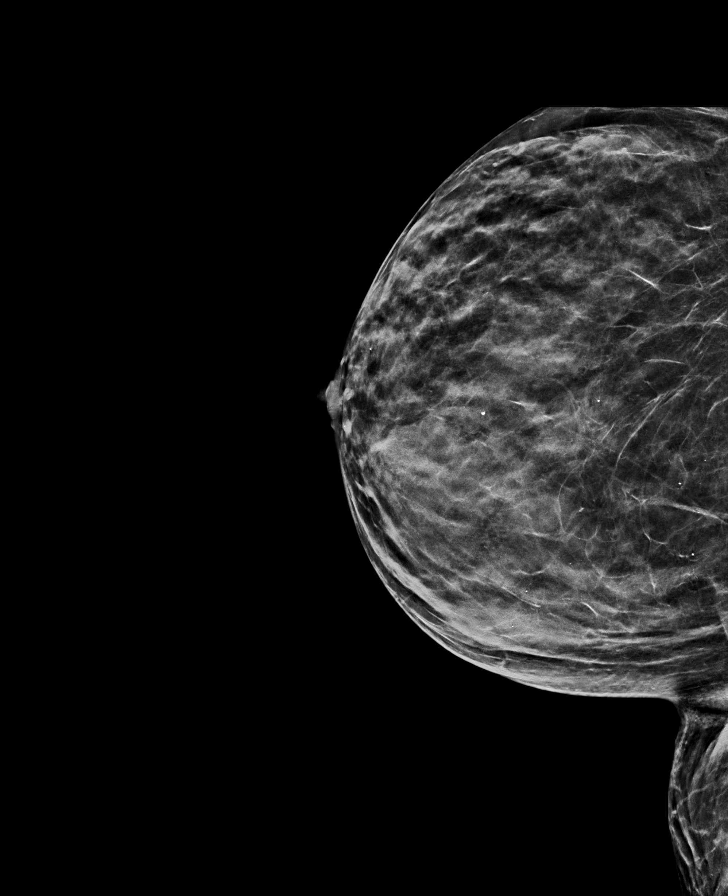

[L CC synth-2D]
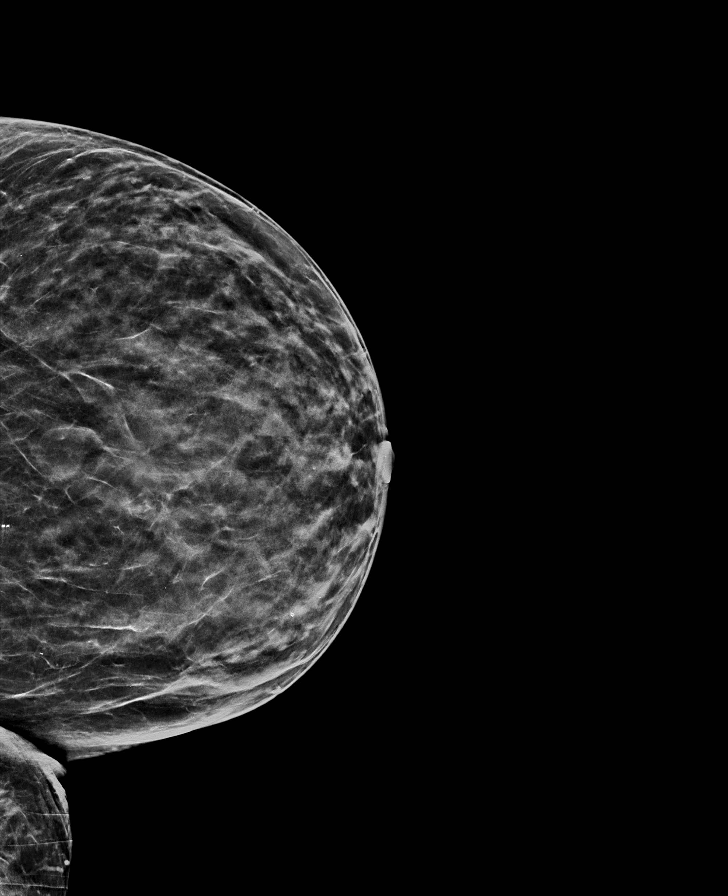

[R MLO synth-2D]
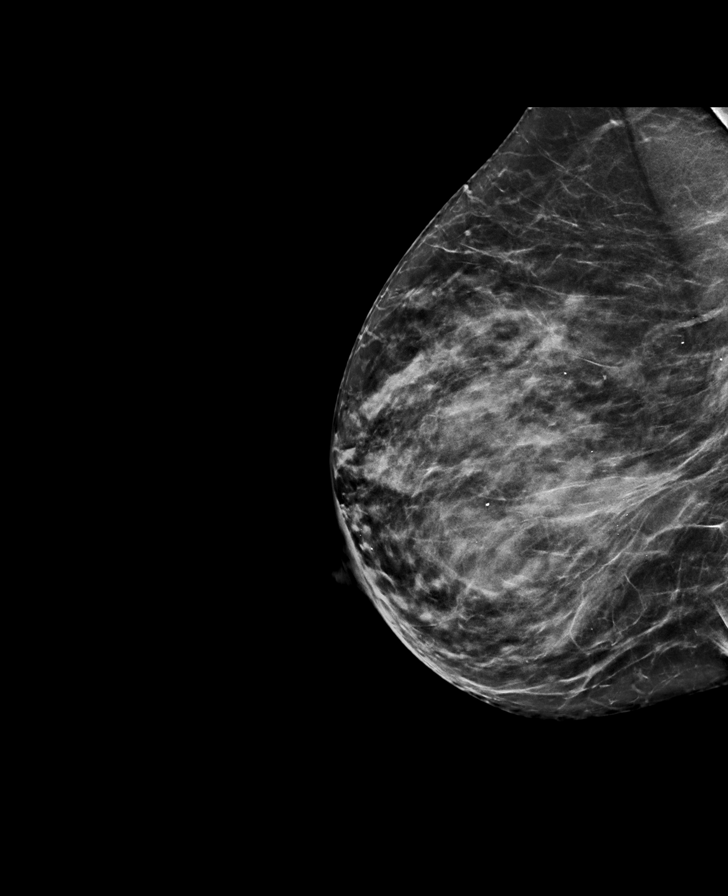

[L MLO tomo · tomo slice 31/61.0]
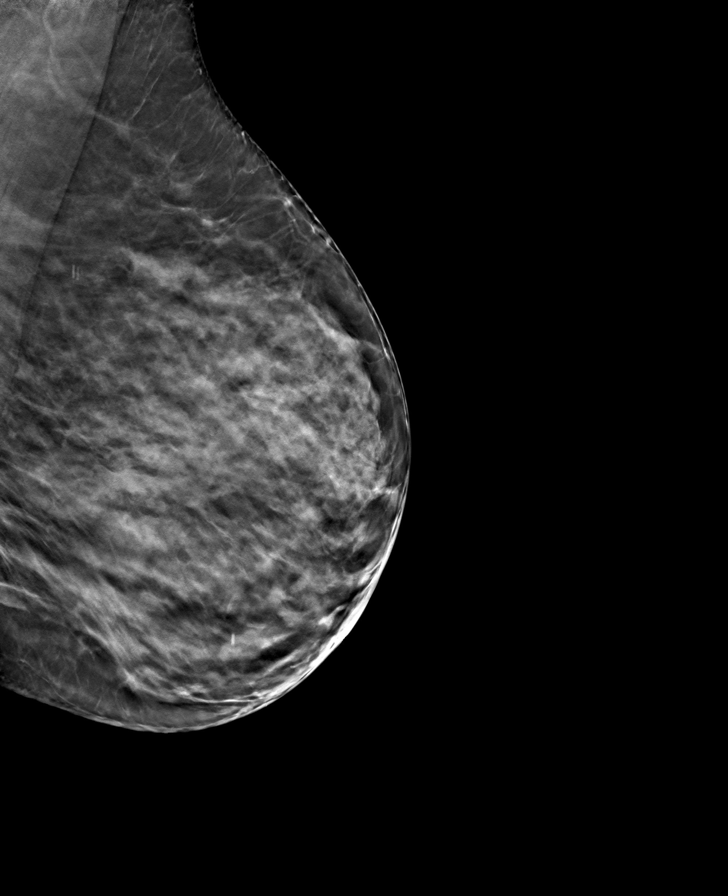

[R MLO tomo · tomo slice 35/68.0]
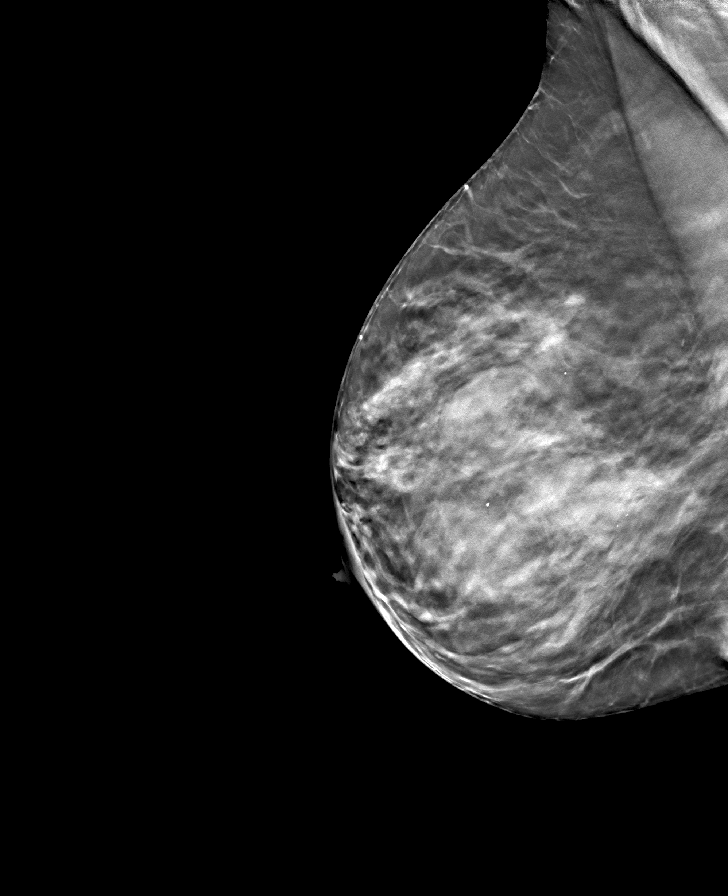

[L CC tomo · tomo slice 29/58.0]
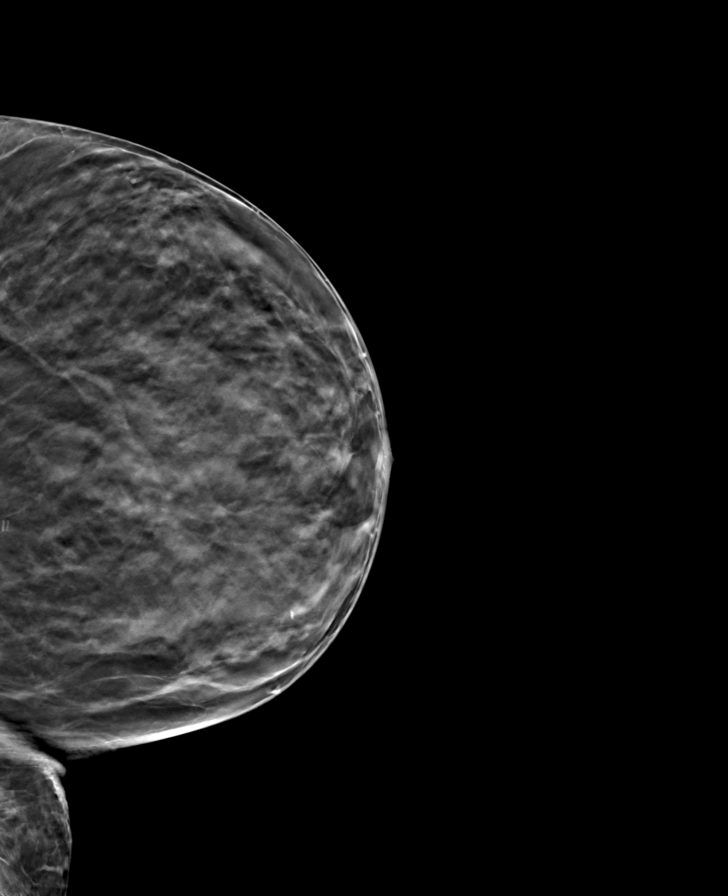

[R CC tomo · tomo slice 29/58.0]
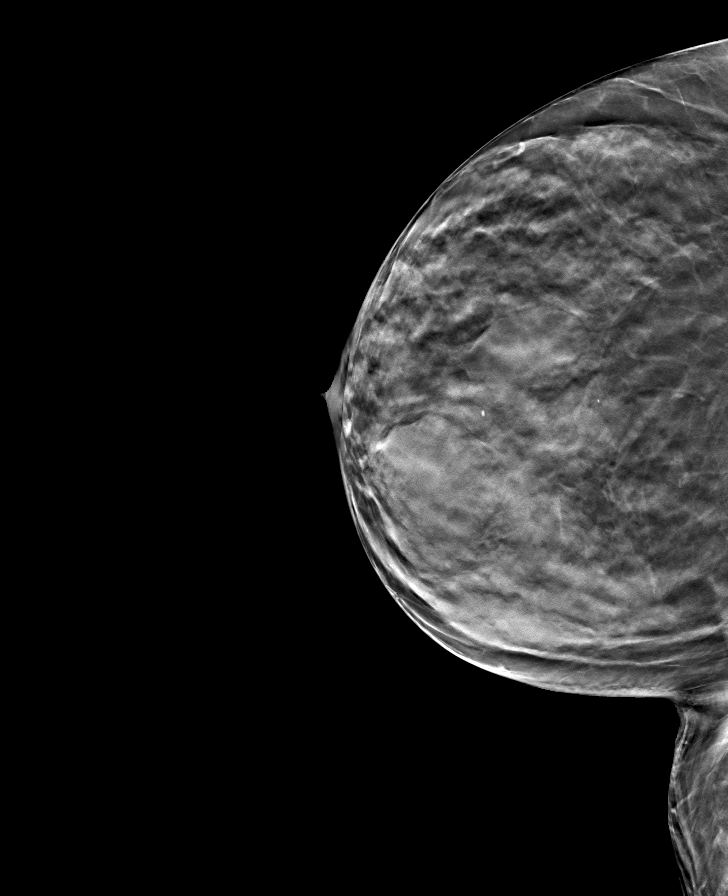

[8 of 24 positions shown; findings below may reference images not displayed]

ACR Breast Density Category c: The breast tissue is heterogeneously
dense, which may obscure small masses.
FINDINGS: There are no findings suspicious for malignancy. Images were
processed with CAD.
IMPRESSION: No mammographic evidence of malignancy. A result letter of this
screening mammogram will be mailed directly to the patient.

RECOMMENDATION:
Screening mammogram in one year. (Code:[id number])

BI-RADS CATEGORY  1: Negative.

## 2020-10-16 ENCOUNTER — Ambulatory Visit: Payer: Medicare HMO | Admitting: Cardiovascular Disease

## 2020-10-21 ENCOUNTER — Ambulatory Visit: Payer: Medicare HMO | Admitting: Cardiovascular Disease

## 2020-12-07 ENCOUNTER — Other Ambulatory Visit: Payer: Self-pay | Admitting: Cardiovascular Disease

## 2020-12-07 NOTE — Telephone Encounter (Signed)
Please refill for 3 months and make follow up appt before further refills

## 2021-01-08 ENCOUNTER — Ambulatory Visit: Payer: Medicare HMO | Admitting: Cardiovascular Disease

## 2021-01-08 ENCOUNTER — Other Ambulatory Visit: Payer: Self-pay

## 2021-01-08 ENCOUNTER — Encounter: Payer: Self-pay | Admitting: Cardiovascular Disease

## 2021-01-08 VITALS — BP 142/60 | HR 65 | Ht 60.0 in | Wt 132.6 lb

## 2021-01-08 DIAGNOSIS — E7800 Pure hypercholesterolemia, unspecified: Secondary | ICD-10-CM

## 2021-01-08 DIAGNOSIS — I2510 Atherosclerotic heart disease of native coronary artery without angina pectoris: Secondary | ICD-10-CM | POA: Diagnosis not present

## 2021-01-08 DIAGNOSIS — I10 Essential (primary) hypertension: Secondary | ICD-10-CM

## 2021-01-08 DIAGNOSIS — I350 Nonrheumatic aortic (valve) stenosis: Secondary | ICD-10-CM
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
## 2021-01-08 NOTE — Patient Instructions (Signed)
Medication Instructions:  No changes *If you need a refill on your cardiac medications before your next appointment, please call your pharmacy*   Lab Work: None ordered If you have labs (blood work) drawn today and your tests are completely normal, you will receive your results only by: Marland Kitchen MyChart Message (if you have MyChart) OR . A paper copy in the mail If you have any lab test that is abnormal or we need to change your treatment, we will call you to review the results.   Testing/Procedures: Your physician has requested that you have an echocardiogram in 12 months. Echocardiography is a painless test that uses sound waves to create images of your heart. It provides your doctor with information about the size and shape of your heart and how well your heart's chambers and valves are working. You may receive an ultrasound enhancing agent through an IV if needed to better visualize your heart during the echo.This procedure takes approximately one hour. There are no restrictions for this procedure. This will take place at the [address], [address].     Follow-Up: At [facility], you and your health needs are our priority.  As part of our continuing mission to provide you with exceptional heart care, we have created designated Provider Care Teams.  These Care Teams include your primary Cardiologist (physician) and Advanced Practice Providers (APPs -  Physician Assistants and Nurse Practitioners) who all work together to provide you with the care you need, when you need it.  We recommend signing up for the patient portal called "MyChart".  Sign up information is provided on this After Visit Summary.  MyChart is used to connect with patients for Virtual Visits (Telemedicine).  Patients are able to view lab/test results, encounter notes, upcoming appointments, etc.  Non-urgent messages can be sent to your provider as well.   To learn more about what you can do with MyChart, go to  [email or web address].    Your next appointment:   12 month(s)  The format for your next appointment:   In Person  Provider:   You may see Sanda Klein, MD or one of the following Advanced Practice Providers on your designated Care Team:    Almyra Deforest, PA-C  Fabian Sharp, PA-C or   Roby Lofts, [place]

## 2021-01-21 DIAGNOSIS — I2510 Atherosclerotic heart disease of native coronary artery without angina pectoris: Secondary | ICD-10-CM | POA: Diagnosis not present

## 2021-01-21 DIAGNOSIS — I10 Essential (primary) hypertension: Secondary | ICD-10-CM | POA: Diagnosis not present

## 2021-01-21 DIAGNOSIS — Z6825 Body mass index (BMI) 25.0-25.9, adult: Secondary | ICD-10-CM | POA: Diagnosis not present

## 2021-01-21 DIAGNOSIS — Z1389 Encounter for screening for other disorder: Secondary | ICD-10-CM | POA: Diagnosis not present

## 2021-01-21 DIAGNOSIS — E7849 Other hyperlipidemia: Secondary | ICD-10-CM | POA: Diagnosis not present

## 2021-01-21 DIAGNOSIS — Z1331 Encounter for screening for depression: Secondary | ICD-10-CM | POA: Diagnosis not present

## 2021-01-21 DIAGNOSIS — E538 Deficiency of other specified B group vitamins: Secondary | ICD-10-CM | POA: Diagnosis not present

## 2021-01-21 DIAGNOSIS — E559 Vitamin D deficiency, unspecified: Secondary | ICD-10-CM | POA: Diagnosis not present

## 2021-01-21 DIAGNOSIS — Z0001 Encounter for general adult medical examination with abnormal findings: Secondary | ICD-10-CM | POA: Diagnosis not present

## 2021-02-11 ENCOUNTER — Other Ambulatory Visit: Payer: Self-pay | Admitting: Cardiovascular Disease

## 2021-02-18 ENCOUNTER — Other Ambulatory Visit: Payer: Self-pay | Admitting: Cardiovascular Disease

## 2021-03-03 ENCOUNTER — Other Ambulatory Visit: Payer: Self-pay | Admitting: Cardiovascular Disease

## 2021-04-22 DIAGNOSIS — Z23 Encounter for immunization: Secondary | ICD-10-CM | POA: Diagnosis not present

## 2021-05-06 ENCOUNTER — Other Ambulatory Visit: Payer: Self-pay | Admitting: Cardiovascular Disease

## 2021-08-05 DIAGNOSIS — Z23 Encounter for immunization: Secondary | ICD-10-CM | POA: Diagnosis not present

## 2022-01-04 ENCOUNTER — Other Ambulatory Visit (HOSPITAL_COMMUNITY): Payer: Self-pay | Admitting: Surgery

## 2022-01-04 ENCOUNTER — Other Ambulatory Visit (HOSPITAL_COMMUNITY): Payer: Self-pay | Admitting: Cardiovascular Disease

## 2022-01-04 DIAGNOSIS — I350 Nonrheumatic aortic (valve) stenosis: Secondary | ICD-10-CM

## 2022-01-07 ENCOUNTER — Other Ambulatory Visit (HOSPITAL_COMMUNITY): Payer: Medicare HMO

## 2022-01-13 ENCOUNTER — Ambulatory Visit (HOSPITAL_COMMUNITY): Payer: Medicare HMO | Attending: Cardiology

## 2022-01-13 DIAGNOSIS — I350 Nonrheumatic aortic (valve) stenosis: Secondary | ICD-10-CM

## 2022-01-13 LAB — ECHOCARDIOGRAM COMPLETE
AR max vel: 1.13 cm2
AV Area VTI: 1.24 cm2
AV Area mean vel: 1.11 cm2
AV Mean grad: 11.5 mmHg
AV Peak grad: 23.9 mmHg
Ao pk vel: 2.45 m/s
Area-P 1/2: 4.8 cm2
S' Lateral: 2 cm

## 2022-01-14 ENCOUNTER — Telehealth: Payer: Self-pay | Admitting: Cardiovascular Disease

## 2022-01-14 NOTE — Telephone Encounter (Signed)
Patient states that an Ann Patel called her yesterday and told her to come back and take two more pictures of her heart.  She would like to speak to the nurse about this.   ?

## 2022-01-14 NOTE — Telephone Encounter (Signed)
Called patient, advised that the only thing I seen in her chart is the ECHO, gave those results again. Verbalized understanding.  ?She advised I did not see any other telephone call to review or that images were needed.  ? ?Patient thankful for call back.  ? ?

## 2022-01-14 NOTE — Telephone Encounter (Signed)
Patient is returning phone call about her echo results ?

## 2022-01-14 NOTE — Telephone Encounter (Signed)
Pt updated and verbalized understanding.  ? ?Sanda Klein, MD  ?01/13/2022  8:22 PM EDT   ?  ?Good news. Heart pumping strength remains normal and aortic valve narrowing remains mild.  ? ?

## 2022-01-31 ENCOUNTER — Other Ambulatory Visit: Payer: Self-pay | Admitting: Cardiovascular Disease

## 2022-02-11 ENCOUNTER — Other Ambulatory Visit: Payer: Self-pay | Admitting: Cardiovascular Disease

## 2022-02-17 ENCOUNTER — Other Ambulatory Visit: Payer: Self-pay | Admitting: Cardiovascular Disease

## 2022-02-25 ENCOUNTER — Other Ambulatory Visit: Payer: Self-pay | Admitting: Cardiovascular Disease

## 2022-03-08 ENCOUNTER — Ambulatory Visit (INDEPENDENT_AMBULATORY_CARE_PROVIDER_SITE_OTHER): Payer: Medicare HMO | Admitting: Cardiovascular Disease

## 2022-03-08 ENCOUNTER — Encounter: Payer: Self-pay | Admitting: Cardiovascular Disease

## 2022-03-08 VITALS — BP 132/60 | HR 70 | Ht <= 58 in | Wt 126.2 lb

## 2022-03-08 DIAGNOSIS — I350 Nonrheumatic aortic (valve) stenosis: Secondary | ICD-10-CM | POA: Diagnosis not present

## 2022-03-08 DIAGNOSIS — E7800 Pure hypercholesterolemia, unspecified: Secondary | ICD-10-CM | POA: Diagnosis not present

## 2022-03-08 DIAGNOSIS — I10 Essential (primary) hypertension: Secondary | ICD-10-CM

## 2022-03-08 DIAGNOSIS — I2510 Atherosclerotic heart disease of native coronary artery without angina pectoris: Secondary | ICD-10-CM

## 2022-03-08 MED ORDER — NITROGLYCERIN 0.4 MG SL SUBL: 0.4000 mg | SUBLINGUAL_TABLET | SUBLINGUAL | 1 refills | Status: AC | PRN
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
## 2022-03-08 NOTE — Patient Instructions (Signed)

## 2022-03-10 ENCOUNTER — Other Ambulatory Visit: Payer: Self-pay

## 2022-03-10 ENCOUNTER — Telehealth: Payer: Self-pay | Admitting: Cardiovascular Disease

## 2022-03-10 ENCOUNTER — Other Ambulatory Visit (HOSPITAL_COMMUNITY)
Admission: RE | Admit: 2022-03-10 | Discharge: 2022-03-10 | Disposition: A | Discharge status: Home / Self Care | Payer: Medicare HMO | Source: Ambulatory Visit | Attending: Cardiovascular Disease | Admitting: Cardiovascular Disease

## 2022-03-10 ENCOUNTER — Other Ambulatory Visit: Payer: Self-pay | Admitting: Cardiovascular Disease

## 2022-03-10 DIAGNOSIS — E7800 Pure hypercholesterolemia, unspecified: Secondary | ICD-10-CM

## 2022-03-10 LAB — LIPID PANEL
Cholesterol: 139 mg/dL (ref 0–200)
HDL: 58 mg/dL (ref >40)
LDL Cholesterol: 76 mg/dL (ref 0–99)
Total CHOL/HDL Ratio: 2.4 RATIO
Triglycerides: 27 mg/dL (ref <150)
VLDL: 5 mg/dL (ref 0–40)

## 2022-03-10 NOTE — Telephone Encounter (Signed)
Spoke with Ann Patel, orders are not seen in the computer. Spoke with Dr. Sallyanne Kuster, he wants a Lipid panel drawn on pt. Order placed in computer.

## 2022-03-10 NOTE — Telephone Encounter (Signed)
Ann Patel from [facility] states the patient is there for lab work, but they do not have any orders.

## 2022-03-11 ENCOUNTER — Telehealth: Payer: Self-pay | Admitting: *Deleted

## 2022-03-11 MED ORDER — ATORVASTATIN CALCIUM 40 MG PO TABS: 40.0000 mg | ORAL_TABLET | Freq: Every day | ORAL | 3 refills | Status: AC

## 2022-03-11 NOTE — Telephone Encounter (Signed)
-----   Message from Sanda Klein, MD sent at 03/10/2022 12:11 PM EDT ----- Cholesterol numbers are not bad, but not quite in target range.  Please have her increase the atorvastatin to 40 mg daily.

## 2022-03-11 NOTE — Telephone Encounter (Signed)
pt aware of results  New script sent to the pharmacy  

## 2022-03-15 DIAGNOSIS — M1712 Unilateral primary osteoarthritis, left knee: Secondary | ICD-10-CM | POA: Diagnosis not present

## 2022-03-15 DIAGNOSIS — Z967 Presence of other bone and tendon implants: Secondary | ICD-10-CM | POA: Diagnosis not present

## 2022-03-15 DIAGNOSIS — S82142D Displaced bicondylar fracture of left tibia, subsequent encounter for closed fracture with routine healing: Secondary | ICD-10-CM | POA: Diagnosis not present

## 2022-03-15 DIAGNOSIS — M85862 Other specified disorders of bone density and structure, left lower leg: Secondary | ICD-10-CM | POA: Diagnosis not present

## 2022-03-15 DIAGNOSIS — S82202D Unspecified fracture of shaft of left tibia, subsequent encounter for closed fracture with routine healing: Secondary | ICD-10-CM | POA: Diagnosis not present

## 2022-03-15 DIAGNOSIS — T849XXS Unspecified complication of internal orthopedic prosthetic device, implant and graft, sequela: Secondary | ICD-10-CM | POA: Diagnosis not present

## 2022-03-15 DIAGNOSIS — T8489XS Other specified complication of internal orthopedic prosthetic devices, implants and grafts, sequela: Secondary | ICD-10-CM | POA: Diagnosis not present

## 2022-03-15 DIAGNOSIS — S82102D Unspecified fracture of upper end of left tibia, subsequent encounter for closed fracture with routine healing: Secondary | ICD-10-CM | POA: Diagnosis not present

## 2022-04-08 ENCOUNTER — Other Ambulatory Visit: Payer: Self-pay | Admitting: Cardiovascular Disease

## 2022-05-13 ENCOUNTER — Other Ambulatory Visit: Payer: Self-pay | Admitting: Cardiovascular Disease

## 2022-05-31 ENCOUNTER — Other Ambulatory Visit: Payer: Self-pay | Admitting: Cardiovascular Disease

## 2022-06-20 DIAGNOSIS — M1991 Primary osteoarthritis, unspecified site: Secondary | ICD-10-CM | POA: Diagnosis not present

## 2022-06-20 DIAGNOSIS — E782 Mixed hyperlipidemia: Secondary | ICD-10-CM | POA: Diagnosis not present

## 2022-06-20 DIAGNOSIS — Z0001 Encounter for general adult medical examination with abnormal findings: Secondary | ICD-10-CM | POA: Diagnosis not present

## 2022-06-20 DIAGNOSIS — I071 Rheumatic tricuspid insufficiency: Secondary | ICD-10-CM | POA: Diagnosis not present

## 2022-06-20 DIAGNOSIS — E7849 Other hyperlipidemia: Secondary | ICD-10-CM | POA: Diagnosis not present

## 2022-06-20 DIAGNOSIS — Z6823 Body mass index (BMI) 23.0-23.9, adult: Secondary | ICD-10-CM | POA: Diagnosis not present

## 2022-06-20 DIAGNOSIS — I2510 Atherosclerotic heart disease of native coronary artery without angina pectoris: Secondary | ICD-10-CM | POA: Diagnosis not present

## 2022-06-20 DIAGNOSIS — Z1331 Encounter for screening for depression: Secondary | ICD-10-CM | POA: Diagnosis not present

## 2022-06-20 DIAGNOSIS — E042 Nontoxic multinodular goiter: Secondary | ICD-10-CM | POA: Diagnosis not present

## 2022-06-20 DIAGNOSIS — I10 Essential (primary) hypertension: Secondary | ICD-10-CM | POA: Diagnosis not present

## 2023-01-31 DIAGNOSIS — I10 Essential (primary) hypertension: Secondary | ICD-10-CM | POA: Diagnosis not present

## 2023-01-31 DIAGNOSIS — E782 Mixed hyperlipidemia: Secondary | ICD-10-CM | POA: Diagnosis not present

## 2023-04-25 DIAGNOSIS — M25511 Pain in right shoulder: Secondary | ICD-10-CM | POA: Diagnosis not present

## 2023-04-25 DIAGNOSIS — I071 Rheumatic tricuspid insufficiency: Secondary | ICD-10-CM | POA: Diagnosis not present

## 2023-04-25 DIAGNOSIS — Z6824 Body mass index (BMI) 24.0-24.9, adult: Secondary | ICD-10-CM | POA: Diagnosis not present

## 2023-04-25 DIAGNOSIS — I10 Essential (primary) hypertension: Secondary | ICD-10-CM | POA: Diagnosis not present

## 2023-04-25 DIAGNOSIS — M1991 Primary osteoarthritis, unspecified site: Secondary | ICD-10-CM | POA: Diagnosis not present

## 2023-05-06 NOTE — Progress Notes (Signed)
Pt has no SDOH needs

## 2023-05-18 ENCOUNTER — Encounter: Payer: Self-pay | Admitting: *Deleted

## 2023-05-18 NOTE — Progress Notes (Signed)
Pt attended 05/06/23 screening event where her b/p was 124/61. At the event, pt listed here PCP as Dr. Assunta Found from [facility] in [place] and did not identify any SDOH insecurities. Chart review indicates pt has her last general adult medical exam with Dr. Phillips Odor on 06/20/2022 and was seen by him again on 01/31/23. Chart review also indicates that pt has had ongoing cardiology and orthopedics healthcare support over the past 12 months. No additional health equity team support indicated at this time.

## 2023-06-23 DIAGNOSIS — I2510 Atherosclerotic heart disease of native coronary artery without angina pectoris: Secondary | ICD-10-CM | POA: Diagnosis not present

## 2023-06-23 DIAGNOSIS — M1991 Primary osteoarthritis, unspecified site: Secondary | ICD-10-CM | POA: Diagnosis not present

## 2023-06-23 DIAGNOSIS — E7849 Other hyperlipidemia: Secondary | ICD-10-CM | POA: Diagnosis not present

## 2023-06-23 DIAGNOSIS — Z0001 Encounter for general adult medical examination with abnormal findings: Secondary | ICD-10-CM | POA: Diagnosis not present

## 2023-06-23 DIAGNOSIS — I10 Essential (primary) hypertension: Secondary | ICD-10-CM | POA: Diagnosis not present

## 2023-06-23 DIAGNOSIS — E538 Deficiency of other specified B group vitamins: Secondary | ICD-10-CM | POA: Diagnosis not present

## 2023-06-23 DIAGNOSIS — Z6824 Body mass index (BMI) 24.0-24.9, adult: Secondary | ICD-10-CM | POA: Diagnosis not present

## 2023-06-23 DIAGNOSIS — E042 Nontoxic multinodular goiter: Secondary | ICD-10-CM | POA: Diagnosis not present

## 2023-06-23 DIAGNOSIS — E782 Mixed hyperlipidemia: Secondary | ICD-10-CM | POA: Diagnosis not present

## 2023-06-23 DIAGNOSIS — Z1331 Encounter for screening for depression: Secondary | ICD-10-CM | POA: Diagnosis not present

## 2023-12-23 NOTE — Progress Notes (Signed)
 Pt screened for HTN, BP found to be WNL. Encouraged to keep follow up appointment with PCP.

## 2024-01-23 NOTE — Progress Notes (Signed)
 Patient attended screening event on 12/23/2023 where Pt B/P was 123/68. At the event pt noted [facility] for insurance coverage, pt document no for tobacco. Pt did not document SDOH needs. Pt did not document PCP at the event. At the event Pt was encouraged to keep f/u appt with PCP by clinician at the event.  Chart review indicates pt PCP is Ann Patel at [facility] in [place]. Pt last PCP office was 06/23/2023 for general adult examination. Pt problem list in [facility] is hypertension, pt is currently on Amlodipine  to manage B/P. Chart review indicates pt does have Insurance. Chart review indicates no SDOH needs. Post event initial f/u [facility] called pt PCP office on 01/23/2024 to confirm pt is established with PCP, pt does not have a future appt scheduled with PCP. No additional Health equity team support indicated at this time.

## 2024-04-10 DIAGNOSIS — G8929 Other chronic pain: Secondary | ICD-10-CM | POA: Diagnosis not present

## 2024-04-10 DIAGNOSIS — E782 Mixed hyperlipidemia: Secondary | ICD-10-CM | POA: Diagnosis not present

## 2024-04-10 DIAGNOSIS — I10 Essential (primary) hypertension: Secondary | ICD-10-CM | POA: Diagnosis not present

## 2024-04-10 DIAGNOSIS — I252 Old myocardial infarction: Secondary | ICD-10-CM | POA: Diagnosis not present

## 2024-04-10 DIAGNOSIS — M79605 Pain in left leg: Secondary | ICD-10-CM | POA: Diagnosis not present

## 2024-04-10 DIAGNOSIS — Z7689 Persons encountering health services in other specified circumstances: Secondary | ICD-10-CM | POA: Diagnosis not present

## 2024-04-11 ENCOUNTER — Other Ambulatory Visit (HOSPITAL_COMMUNITY): Payer: Self-pay

## 2024-04-11 DIAGNOSIS — Z13820 Encounter for screening for osteoporosis: Secondary | ICD-10-CM

## 2024-04-16 ENCOUNTER — Ambulatory Visit (HOSPITAL_COMMUNITY)
Admission: RE | Admit: 2024-04-16 | Discharge: 2024-04-16 | Disposition: A | Discharge status: Home / Self Care | Source: Ambulatory Visit

## 2024-04-16 DIAGNOSIS — Z13820 Encounter for screening for osteoporosis: Secondary | ICD-10-CM | POA: Diagnosis not present

## 2024-04-16 DIAGNOSIS — M810 Age-related osteoporosis without current pathological fracture: Secondary | ICD-10-CM | POA: Diagnosis not present

## 2024-04-16 DIAGNOSIS — Z780 Asymptomatic menopausal state: Secondary | ICD-10-CM | POA: Diagnosis not present

## 2024-04-20 LAB — COLOGUARD

## 2024-04-23 DIAGNOSIS — Z7689 Persons encountering health services in other specified circumstances: Secondary | ICD-10-CM | POA: Diagnosis not present

## 2024-04-23 DIAGNOSIS — E782 Mixed hyperlipidemia: Secondary | ICD-10-CM | POA: Diagnosis not present

## 2024-04-23 DIAGNOSIS — R7301 Impaired fasting glucose: Secondary | ICD-10-CM | POA: Diagnosis not present

## 2024-04-23 DIAGNOSIS — I10 Essential (primary) hypertension: Secondary | ICD-10-CM | POA: Diagnosis not present

## 2024-04-24 LAB — LAB REPORT - SCANNED
A1c: 5.6
Albumin, Urine POC: 45.2
Creatinine, POC: 455 mg/dL
EGFR: 68
Microalb Creat Ratio: 10

## 2024-04-25 DIAGNOSIS — Z1211 Encounter for screening for malignant neoplasm of colon: Secondary | ICD-10-CM | POA: Diagnosis not present

## 2024-04-25 DIAGNOSIS — Z79899 Other long term (current) drug therapy: Secondary | ICD-10-CM | POA: Diagnosis not present

## 2024-04-25 DIAGNOSIS — R630 Anorexia: Secondary | ICD-10-CM | POA: Diagnosis not present

## 2024-04-25 DIAGNOSIS — I10 Essential (primary) hypertension: Secondary | ICD-10-CM | POA: Diagnosis not present

## 2024-04-25 DIAGNOSIS — I119 Hypertensive heart disease without heart failure: Secondary | ICD-10-CM | POA: Diagnosis not present

## 2024-04-25 DIAGNOSIS — E782 Mixed hyperlipidemia: Secondary | ICD-10-CM | POA: Diagnosis not present

## 2024-04-25 DIAGNOSIS — M810 Age-related osteoporosis without current pathological fracture: Secondary | ICD-10-CM | POA: Diagnosis not present

## 2024-05-01 LAB — COLOGUARD

## 2024-05-02 LAB — COLOGUARD

## 2024-05-27 DIAGNOSIS — I10 Essential (primary) hypertension: Secondary | ICD-10-CM | POA: Diagnosis not present

## 2024-05-27 DIAGNOSIS — E782 Mixed hyperlipidemia: Secondary | ICD-10-CM | POA: Diagnosis not present

## 2024-05-27 DIAGNOSIS — M810 Age-related osteoporosis without current pathological fracture: Secondary | ICD-10-CM | POA: Diagnosis not present

## 2024-05-27 DIAGNOSIS — Z79899 Other long term (current) drug therapy: Secondary | ICD-10-CM | POA: Diagnosis not present

## 2024-05-27 DIAGNOSIS — I110 Hypertensive heart disease with heart failure: Secondary | ICD-10-CM | POA: Diagnosis not present

## 2024-05-27 DIAGNOSIS — Z2821 Immunization not carried out because of patient refusal: Secondary | ICD-10-CM | POA: Diagnosis not present

## 2024-05-27 DIAGNOSIS — Z1211 Encounter for screening for malignant neoplasm of colon: Secondary | ICD-10-CM | POA: Diagnosis not present

## 2024-05-27 DIAGNOSIS — R630 Anorexia: Secondary | ICD-10-CM | POA: Diagnosis not present

## 2024-06-24 ENCOUNTER — Other Ambulatory Visit (HOSPITAL_COMMUNITY): Payer: Self-pay

## 2024-06-24 ENCOUNTER — Ambulatory Visit (HOSPITAL_COMMUNITY)
Admission: RE | Admit: 2024-06-24 | Discharge: 2024-06-24 | Disposition: A | Discharge status: Home / Self Care | Source: Ambulatory Visit

## 2024-06-24 DIAGNOSIS — R634 Abnormal weight loss: Secondary | ICD-10-CM | POA: Diagnosis not present

## 2024-06-24 DIAGNOSIS — R630 Anorexia: Secondary | ICD-10-CM | POA: Diagnosis not present

## 2024-06-24 DIAGNOSIS — Z6822 Body mass index (BMI) 22.0-22.9, adult: Secondary | ICD-10-CM | POA: Diagnosis not present

## 2024-06-24 DIAGNOSIS — I700 Atherosclerosis of aorta: Secondary | ICD-10-CM | POA: Diagnosis not present

## 2024-06-24 DIAGNOSIS — Z713 Dietary counseling and surveillance: Secondary | ICD-10-CM | POA: Diagnosis not present

## 2024-06-24 DIAGNOSIS — R9389 Abnormal findings on diagnostic imaging of other specified body structures: Secondary | ICD-10-CM | POA: Diagnosis not present

## 2024-06-24 DIAGNOSIS — K6389 Other specified diseases of intestine: Secondary | ICD-10-CM | POA: Diagnosis not present

## 2024-06-25 ENCOUNTER — Encounter: Payer: Self-pay | Admitting: Internal Medicine

## 2024-07-03 LAB — COLOGUARD

## 2024-07-19 DIAGNOSIS — Z136 Encounter for screening for cardiovascular disorders: Secondary | ICD-10-CM | POA: Diagnosis not present

## 2024-07-19 DIAGNOSIS — R630 Anorexia: Secondary | ICD-10-CM | POA: Diagnosis not present

## 2024-07-19 DIAGNOSIS — R634 Abnormal weight loss: Secondary | ICD-10-CM | POA: Diagnosis not present

## 2024-07-25 DIAGNOSIS — E782 Mixed hyperlipidemia: Secondary | ICD-10-CM | POA: Diagnosis not present

## 2024-07-25 DIAGNOSIS — M810 Age-related osteoporosis without current pathological fracture: Secondary | ICD-10-CM | POA: Diagnosis not present

## 2024-07-25 DIAGNOSIS — R630 Anorexia: Secondary | ICD-10-CM | POA: Diagnosis not present

## 2024-07-25 DIAGNOSIS — R634 Abnormal weight loss: Secondary | ICD-10-CM | POA: Diagnosis not present

## 2024-07-25 DIAGNOSIS — I10 Essential (primary) hypertension: Secondary | ICD-10-CM | POA: Diagnosis not present

## 2024-07-31 ENCOUNTER — Ambulatory Visit: Admitting: Internal Medicine

## 2024-07-31 ENCOUNTER — Telehealth (INDEPENDENT_AMBULATORY_CARE_PROVIDER_SITE_OTHER): Payer: Self-pay

## 2024-07-31 ENCOUNTER — Encounter: Payer: Self-pay | Admitting: Internal Medicine

## 2024-07-31 ENCOUNTER — Encounter: Admitting: Nutrition

## 2024-07-31 VITALS — Ht 60.0 in | Wt 97.0 lb

## 2024-07-31 VITALS — BP 138/67 | HR 68 | Temp 97.9°F | Ht <= 58 in | Wt 95.7 lb

## 2024-07-31 DIAGNOSIS — R1085 Abdominal pain of multiple sites: Secondary | ICD-10-CM | POA: Diagnosis not present

## 2024-07-31 DIAGNOSIS — R630 Anorexia: Secondary | ICD-10-CM

## 2024-07-31 DIAGNOSIS — R634 Abnormal weight loss: Secondary | ICD-10-CM | POA: Insufficient documentation

## 2024-07-31 DIAGNOSIS — E440 Moderate protein-calorie malnutrition: Secondary | ICD-10-CM | POA: Insufficient documentation

## 2024-07-31 NOTE — Telephone Encounter (Signed)
 PA for CT on [facility]: Pending: In clinical review Tracking [id number]

## 2024-07-31 NOTE — Patient Instructions (Signed)
 I am going to order a CT of your abdomen and pelvis to further evaluate your weight loss, poor appetite, abdominal pain.  We will reach out to you with results.  Continue on Megace.  Follow-up in 8 weeks.  It was very nice meeting you both today.  Dr. Cindie

## 2024-07-31 NOTE — Patient Instructions (Addendum)
 Goals  Eat 3 small meals and 2-3 snacks per day Increase water intake with meals to 3-4 bottles (8 oz) per day Drink a nutrition drink once a day Focus on fruits, vegetable and whole grains. Talk to PCP about medication for stress. Gain 2 lbs per month

## 2024-07-31 NOTE — Progress Notes (Signed)
 Medical Nutrition Therapy  Appointment Start time:  1300  Appointment End time:  1400  Primary concerns today: Anorexia , weight loss Referral diagnosis: R63.0 Preferred learning style: no preference Learning readiness: Ready NUTRITION ASSESSMENT  78 yr old bfemale referred for poor appetite/anorexia and weight loss. Had a HA in 2014. And had stent put in. Has lost 30 lbs in the last 2 years. She lost her brother that she was closest to about 6 months ago. She notes she has been sad and depressed some after losing him. Saw GI today and going to schedule a CT scan of her abdomen. Has abdominal pain after eating. Can't eat but a little at at time and gets full easily. Sometimes her food wants to come up. Very thin in appearance. Bony prominents. Bitemporal wasting,  Lives with her other brother who is blind and he causes a lot of stress in her life. She notes her stress is what bothers her the most. Not on any medications for depression or anxiety. She reports this stress does impact her appetite and desire to eat. Goes to the [employer] for activities daily and really enjoys it. Does crossword puzzles for enjoyment Quit eating meat in 2014 when she had a heart attack. Apptite small :only eats 1 thing at each meals. Sometimes skips breakfast is she is going to [employer]. Doesn't eat meals at [facility]. Goes out to lunch with her sister most every day. Has constipated; moves 2-3 times per week.  Her diet is insuffient to meet her nutritional needs contributing to lack of weight gain and weight loss. She is willing to work on eating more consistently with a whole plant based diet to meet her nutritional needs for weight loss.  No recent labs for review. Followed by GI and will be getting a CT of her abdomen to rule out  any problems there. Clinical Wt Readings from Last 3 Encounters:  07/31/24 97 lb (44 kg)  07/31/24 95 lb 11.2 oz (43.4 kg)  03/08/22 126 lb 3.2 oz (57.2 kg)   Ht  Readings from Last 3 Encounters:  07/31/24 5' (1.524 m)  07/31/24 4' 7 (1.397 m)  03/08/22 4' 9 (1.448 m)   Body mass index is 18.94 kg/m. @BMIFA @ Facility age limit for growth %iles is 20 years. Facility age limit for growth %iles is 20 years.  Medical Hx:  Past Medical History:  Diagnosis Date   Coronary artery disease 09/2009   stress test EF 67%   Hypercholesteremia    Hypertension    Myocardial infarction [facility])   ' Medications:  Current Outpatient Medications on File Prior to Visit  Medication Sig Dispense Refill   amLODipine  (NORVASC ) 10 MG tablet TAKE ONE TABLET BY MOUTH (10MG  TOTAL) DAILY 90 tablet 2   aspirin  EC 81 MG tablet Take 81 mg by mouth daily at 12 noon.     atorvastatin  (LIPITOR) 40 MG tablet Take 1 tablet (40 mg total) by mouth daily. 90 tablet 3   clopidogrel  (PLAVIX ) 75 MG tablet TAKE ONE TABLET (75MG  TOTAL) BY MOUTH DAILY 30 tablet 10   feeding supplement, ENSURE ENLIVE, (ENSURE ENLIVE) LIQD Take 237 mLs by mouth 2 (two) times daily between meals. 237 mL 12   lisinopril  (ZESTRIL ) 20 MG tablet TAKE ONE TABLET (20MG  TOTAL) BY MOUTH DAILY 90 tablet 2   megestrol (MEGACE) 40 MG tablet Take 40 mg by mouth 2 (two) times daily.     nitroGLYCERIN  (NITROSTAT ) 0.4 MG SL tablet Place 1  tablet (0.4 mg total) under the tongue every 5 (five) minutes as needed for chest pain. 25 tablet 1   No current facility-administered medications on file prior to visit.    Labs:     Latest Ref Rng & Units 03/19/2018    5:52 PM 08/09/2017    8:30 AM 05/27/2016    8:12 AM  CMP  Glucose 65 - 99 mg/dL 838  91  94   BUN 6 - 20 mg/dL 8  7  10    Creatinine 0.44 - 1.00 mg/dL 9.19  9.32  9.19   Sodium 135 - 145 mmol/L 141  144  143   Potassium 3.5 - 5.1 mmol/L 3.4  3.8  3.7   Chloride 101 - 111 mmol/L 109  106  109   CO2 22 - 32 mmol/L 23  26  27    Calcium  8.9 - 10.3 mg/dL 9.1  9.7  9.0   Total Protein 6.5 - 8.1 g/dL 6.9  6.8  6.7   Total Bilirubin 0.3 - 1.2 mg/dL 0.4  0.4  0.4    Alkaline Phos 38 - 126 U/L 91  81  84   AST 15 - 41 U/L 22  15  19    ALT 14 - 54 U/L 13  8  14     Lipid Panel     Component Value Date/Time   CHOL 139 03/10/2022 0924   CHOL 162 08/09/2017 0830   TRIG 27 03/10/2022 0924   HDL 58 03/10/2022 0924   HDL 57 08/09/2017 0830   CHOLHDL 2.4 03/10/2022 0924   VLDL 5 03/10/2022 0924   LDLCALC 76 03/10/2022 0924   LDLCALC 88 08/09/2017 0830   LABVLDL 17 08/09/2017 0830  No results found for: HGBA1C   Notable Signs/Symptoms: Stressed, anxious, poor appetite, weight loss  Lifestyle & Dietary Hx Lives with her brother who is blind  Estimated daily fluid intake: 30 oz Supplements:  Sleep: good Stress / self-care: a lot of stress living with her brother Current average weekly physical activity: ADL Goes to senior center for activitites  24-Hr Dietary Recall First Meal: skipped breakfast; Snack:  Second Meal: 1/4 baked potato, butter and sweet valdalia dressing; Coffee and sparking water Snack:  Third Meal: Pppcorn, pinto beans and potato salad, sparkling water  Snack:  Beverages: Sparkling water,  Estimated Energy Needs Calories: 1500-1800 Carbohydrate: 170 Pro112 Fat: 42g   NUTRITION DIAGNOSIS  NI-1.4 Inadequate energy intake As related to poor appetite and insuffient calorie and protein intake.  As evidenced by BMI 18 and low weight loss..   NUTRITION INTERVENTION  Nutrition education (E-1) on the following topics:  High Calorie High Protein Diet Ways to increase calories Lifestyle Medicine  - Whole Food, Plant Predominant Nutrition is highly recommended: Eat Plenty of vegetables, Mushrooms, fruits, Legumes, Whole Grains, Nuts, seeds in lieu of processed meats, processed snacks/pastries red meat, poultry, eggs.    -It is better to avoid simple carbohydrates including: Cakes, Sweet Desserts, Ice Cream, Soda (diet and regular), Sweet Tea, Candies, Chips, Cookies, Store Bought Juices, Alcohol in Excess of  1-2 drinks a  day, Lemonade,  Artificial Sweeteners, Doughnuts, Coffee Creamers, Sugar-free Products, etc, etc.  This is not a complete list.....  Exercise: If you are able: 30 -60 minutes a day ,4 days a week, or 150 minutes a week.  The longer the better.  Combine stretch, strength, and aerobic activities.  If you were told in the past that you have high risk for cardiovascular diseases, you  may seek evaluation by your heart doctor prior to initiating moderate to intense exercise programs.    Handouts Provided Include  Lifestyle Medicine  Learning Style & Readiness for Change Teaching method utilized: Visual & Auditory  Demonstrated degree of understanding via: Teach Back  Barriers to learning/adherence to lifestyle change: Depression/anxiety  Goals Established by Pt  Eat 3 small meals and 2-3 snacks per day Increase water intake with meals to 3-4 bottles (8 oz) per day Drink a nutrition drink once a day Focus on fruits, vegetable and whole grains. Talk to PCP about medication for stress. Gain 2 lbs per month   MONITORING & EVALUATION Follow up on intake, weekly physical activity, and weight  in 1 month..  GNext Steps  Patient is to work on eating small frequent meals and talk to MD about medications for depression or something for her stress/nerves.[name]

## 2024-07-31 NOTE — Progress Notes (Signed)
 Primary Care Physician:  Gladis Lauraine BRAVO, NP Primary Gastroenterologist:  Dr. Cindie  Chief Complaint  Patient presents with   early satiety    States that she eats and gets full quickly. Been going on for about 3 or 4 months.    HPI:   Ann Patel is a 78 y.o. female who presents to the clinic today by referral from her PCP Lauraine Gladis for evaluation.  Patient complains of weight loss for the last 3 to 4 months.  Weight today 95 pounds.  States she previously weighed approximately 120 pounds.  Last weight I can see in her chart from 2023 was 126 pounds.  Endorses poor appetite.  Will feel hungry at times though when she starts eating she will become full  quickly.  Also endorses postprandial abdominal pain.  Primarily epigastric periumbilical but occasionally left lower quadrant right lower quadrant.  Denies any issues with her bowels including constipation or chronic diarrhea.  Denies any acid reflux heartburn, odynophagia or dysphagia.  Reports she had a colonoscopy many years ago.  She has submitted 3 Cologuard samples recently which were unable to be processed.  Does have a vascular history including coronary stent, mild aortic stenosis.  Chronically on Plavix .  Past Medical History:  Diagnosis Date   Coronary artery disease 09/2009   stress test EF 67%   Hypercholesteremia    Hypertension    Myocardial infarction [facility])     Past Surgical History:  Procedure Laterality Date   BREAST CYST EXCISION     fibro cyst removal   CORONARY ANGIOPLASTY WITH STENT PLACEMENT Right 07/2005   receiving two consecutive drug eluting 2.5x78mm Taxus stents in the proximal to mid right coronary artery   TONSILLECTOMY      Current Outpatient Medications  Medication Sig Dispense Refill   amLODipine  (NORVASC ) 10 MG tablet TAKE ONE TABLET BY MOUTH (10MG  TOTAL) DAILY 90 tablet 2   aspirin  EC 81 MG tablet Take 81 mg by mouth daily at 12 noon.     atorvastatin  (LIPITOR) 40 MG  tablet Take 1 tablet (40 mg total) by mouth daily. 90 tablet 3   clopidogrel  (PLAVIX ) 75 MG tablet TAKE ONE TABLET (75MG  TOTAL) BY MOUTH DAILY 30 tablet 10   feeding supplement, ENSURE ENLIVE, (ENSURE ENLIVE) LIQD Take 237 mLs by mouth 2 (two) times daily between meals. 237 mL 12   lisinopril  (ZESTRIL ) 20 MG tablet TAKE ONE TABLET (20MG  TOTAL) BY MOUTH DAILY 90 tablet 2   megestrol (MEGACE) 40 MG tablet Take 40 mg by mouth 2 (two) times daily.     nitroGLYCERIN  (NITROSTAT ) 0.4 MG SL tablet Place 1 tablet (0.4 mg total) under the tongue every 5 (five) minutes as needed for chest pain. 25 tablet 1   No current facility-administered medications for this visit.    Allergies as of 07/31/2024   (No Known Allergies)    Family History  Problem Relation Age of Onset   Stroke Mother 53       deceased   Cancer Sister 16       deceased    Social History   Socioeconomic History   Marital status: Divorced    Spouse name: Not on file   Number of children: Not on file   Years of education: Not on file   Highest education level: Not on file  Occupational History   Not on file  Tobacco Use   Smoking status: Never   Smokeless tobacco: Never  Substance and  Sexual Activity   Alcohol use: No   Drug use: No   Sexual activity: Not Currently  Other Topics Concern   Not on file  Social History Narrative   Not on file   Social Drivers of Health   Financial Resource Strain: Not on file  Food Insecurity: No Food Insecurity (12/23/2023)   Hunger Vital Sign    Worried About Running Out of Food in the Last Year: Never true    Ran Out of Food in the Last Year: Never true  Transportation Needs: No Transportation Needs (12/23/2023)   PRAPARE - Administrator, Civil Service (Medical): No    Lack of Transportation (Non-Medical): No  Physical Activity: Not on file  Stress: Not on file  Social Connections: Not on file  Intimate Partner Violence: Not At Risk (12/23/2023)   Humiliation,  Afraid, Rape, and Kick questionnaire    Fear of Current or Ex-Partner: No    Emotionally Abused: No    Physically Abused: No    Sexually Abused: No    Subjective: Review of Systems  Constitutional:  Negative for chills and fever.  HENT:  Negative for congestion and hearing loss.   Eyes:  Negative for blurred vision and double vision.  Respiratory:  Negative for cough and shortness of breath.   Cardiovascular:  Negative for chest pain and palpitations.  Gastrointestinal:  Negative for abdominal pain, blood in stool, constipation, diarrhea, heartburn, melena and vomiting.  Genitourinary:  Negative for dysuria and urgency.  Musculoskeletal:  Negative for joint pain and myalgias.  Skin:  Negative for itching and rash.  Neurological:  Negative for dizziness and headaches.  Psychiatric/Behavioral:  Negative for depression. The patient is not nervous/anxious.        Objective: BP 138/67 (BP Location: Left Arm, Patient Position: Sitting, Cuff Size: Normal)   Pulse 68   Temp 97.9 F (36.6 C) (Oral)   Ht 4' 7 (1.397 m)   Wt 95 lb 11.2 oz (43.4 kg)   SpO2 98%   BMI 22.24 kg/m  Physical Exam Constitutional:      Appearance: Normal appearance.  HENT:     Head: Normocephalic and atraumatic.  Eyes:     Extraocular Movements: Extraocular movements intact.     Conjunctiva/sclera: Conjunctivae normal.  Cardiovascular:     Rate and Rhythm: Normal rate and regular rhythm.     Heart sounds: Murmur heard.  Pulmonary:     Effort: Pulmonary effort is normal.     Breath sounds: Normal breath sounds.  Abdominal:     General: Bowel sounds are normal.     Palpations: Abdomen is soft.  Musculoskeletal:        General: No swelling. Normal range of motion.     Cervical back: Normal range of motion and neck supple.  Skin:    General: Skin is warm and dry.     Coloration: Skin is not jaundiced.  Neurological:     General: No focal deficit present.     Mental Status: She is alert and  oriented to person, place, and time.  Psychiatric:        Mood and Affect: Mood normal.        Behavior: Behavior normal.      Assessment: *Abdominal pain-postprandial *Weight loss *Poor appetite/anorexia  Plan: Etiology of patient's symptoms unclear.  Given her vascular history, need to rule out mesenteric ischemia.  Will order CT angio abdomen pelvis to further evaluate.  Call with results.  Continue on  Megace.  Recommend protein shake supplement.  May need to consider endoscopic evaluation with EGD/colonoscopy pending CT findings.  Thank you Lauraine Lunger for the kind referral.  Follow-up in 8 weeks.  07/31/2024 11:05 AM

## 2024-08-05 ENCOUNTER — Encounter: Payer: Self-pay | Admitting: Nutrition

## 2024-08-05 NOTE — Telephone Encounter (Signed)
 Update on PA for CT on [place]:  Approved Authorization #[id number]  Tracking [id number]

## 2024-08-05 NOTE — Telephone Encounter (Signed)
 Spoke with patients sister Dagoberto (on [place]) scheduled CT for patient on 09/30/2024 at 4pm (first available) . Patients sister verbalized understanding.

## 2024-08-27 ENCOUNTER — Encounter: Payer: Self-pay | Admitting: Internal Medicine

## 2024-09-02 ENCOUNTER — Telehealth: Payer: Self-pay | Admitting: Nutrition

## 2024-09-02 NOTE — Telephone Encounter (Signed)
Tried returning call and no answer.

## 2024-09-05 ENCOUNTER — Encounter: Admitting: Nutrition

## 2024-09-05 ENCOUNTER — Telehealth: Payer: Self-pay | Admitting: Nutrition

## 2024-09-05 NOTE — Telephone Encounter (Signed)
 Called and she will  reschedule after Thanksgiving.

## 2024-09-23 NOTE — Telephone Encounter (Signed)
 Patient was scheduled outside of valid dates of service. DOS updated  09/23/2024 - 10/02/2024

## 2024-09-30 ENCOUNTER — Ambulatory Visit (HOSPITAL_COMMUNITY)
Admission: RE | Admit: 2024-09-30 | Discharge: 2024-09-30 | Disposition: A | Discharge status: Home / Self Care | Source: Ambulatory Visit | Attending: Internal Medicine | Admitting: Internal Medicine

## 2024-09-30 DIAGNOSIS — R1085 Abdominal pain of multiple sites: Secondary | ICD-10-CM | POA: Insufficient documentation

## 2024-09-30 DIAGNOSIS — R634 Abnormal weight loss: Secondary | ICD-10-CM | POA: Diagnosis present

## 2024-09-30 DIAGNOSIS — R630 Anorexia: Secondary | ICD-10-CM | POA: Diagnosis present

## 2024-09-30 MED ORDER — IOHEXOL 350 MG/ML SOLN
75.0000 mL | Freq: Once | INTRAVENOUS | Status: AC | PRN
  Administered 2024-09-30: 75 mL via INTRAVENOUS

## 2024-10-09 ENCOUNTER — Telehealth (INDEPENDENT_AMBULATORY_CARE_PROVIDER_SITE_OTHER): Payer: Self-pay | Admitting: Internal Medicine

## 2024-10-09 NOTE — Telephone Encounter (Signed)
 I spoke with the patient and made her aware per Dr. Cindie, Per Dr. Cindie on Secure chat 10/10/2023 at 4:31pm. Patient has mild calcifications of the blood vessels supply her GI tract but no significant stenosis. Would continue on Megace and protein shakes. We can consider further evaluation with EGD/CLN if needed. Per Dr. Cindie schedule an appointment with him or any app.  Patient states understanding of all.   Patient was transferred to the front office for an appointment.  She was given an appointment with next available provider, which was Sonny Kerns on 10/11/2024 at 9 am at [facility].

## 2024-10-09 NOTE — Telephone Encounter (Signed)
 Scan has not yet been read. I have called [facility] radiology and spoke with Randine in the reading room. She will expedite the results and we will notify her of results and recommendations once Dr. Cindie has read and given recommendations.

## 2024-10-09 NOTE — Telephone Encounter (Signed)
 Per Dr. Cindie on Secure chat 10/10/2023 at 4:31pm. she has mild calcifications of the blood vessels supply her GI tract but no significant stenosis. Would continue on Megace and protein shakes. We can consider further evaluation with EGD/CLN if needed. To discuss on fu visit. Thank you  Ann Patel was added by Ann MARLA Cindie, Ann Patel. 1 min CC Charles K Carver, Ann Patel Ann please schedule this patient for OV with myself or any of the APPs. Thank you

## 2024-10-09 NOTE — Telephone Encounter (Signed)
 Patient came to front desk asking if her results from a CT she had done on 12/29 were available. Please call [phone]

## 2024-10-09 NOTE — Telephone Encounter (Signed)
 [name]

## 2024-10-09 NOTE — Telephone Encounter (Signed)
 I have spoken with the patient and made her aware, The scan has not yet been read. I have called [facility] radiology and spoke with Randine in the reading room. She will expedite the results and we will notify her of results and recommendations once Dr. Cindie has read and given recommendations. Patient states understanding.

## 2024-10-09 NOTE — Telephone Encounter (Signed)
Thanks for the updates.

## 2024-10-11 ENCOUNTER — Telehealth: Payer: Self-pay | Admitting: *Deleted

## 2024-10-11 ENCOUNTER — Encounter: Payer: Self-pay | Admitting: Gastroenterology

## 2024-10-11 ENCOUNTER — Ambulatory Visit: Admitting: Gastroenterology

## 2024-10-11 VITALS — BP 135/68 | HR 69 | Temp 98.5°F | Ht 60.0 in | Wt 103.2 lb

## 2024-10-11 DIAGNOSIS — R6881 Early satiety: Secondary | ICD-10-CM | POA: Insufficient documentation

## 2024-10-11 DIAGNOSIS — R1010 Upper abdominal pain, unspecified: Secondary | ICD-10-CM | POA: Diagnosis not present

## 2024-10-11 DIAGNOSIS — R630 Anorexia: Secondary | ICD-10-CM | POA: Diagnosis not present

## 2024-10-11 DIAGNOSIS — R1024 Suprapubic pain: Secondary | ICD-10-CM | POA: Insufficient documentation

## 2024-10-11 MED ORDER — POLYETHYLENE GLYCOL 3350 17 GM/SCOOP PO POWD: ORAL | Status: AC

## 2024-10-11 NOTE — Progress Notes (Unsigned)
 "    GI Office Note    Referring Provider: Gladis Lauraine BRAVO, NP Primary Care Physician:  Gladis Lauraine BRAVO, NP  Primary Gastroenterologist: Carlin POUR. Cindie, DO   Chief Complaint   Chief Complaint  Patient presents with   Follow-up    Follow up. Still having problems with stomach. It hurts after she eats. She has a lot of gas.     History of Present Illness   Ann Patel is a 79 y.o. female presenting today for follow-up.    She was seen back in October 2025 with complaints of weight loss, poor appetite, postprandial abdominal pain primarily in the epigastric periumbilical but occasionally left lower quadrant and right lower quadrant.  Patient reported previous colonoscopy years ago.  She submitted 3 Cologuard samples recently which were unable to be processed.  Has a vascular history including coronary stent, mild aortic stenosis on Plavix .  Given her vascular history, she had a CT angio abdomen pelvis completed since her last appointment.  See results below.  Discussed the use of AI scribe software for clinical note transcription with the patient, who gave verbal consent to proceed.  History of Present Illness   Since June 2023, her weight has decreased from 126 lb to 95 lb by October 2025, with a recent increase to 103 lb. She eats smaller portions since her 2014 heart attack and avoids many foods. Tries to eat healthy. She is not sure when her current symptoms started but it has been for around six months. Her appetite is poor although maybe slightly better. She is trying to eat more but admits to not eating regularly. She will go out to eat with her sister 3-4 times per week and orders one item, such as a salad, baked potato, or fries, and rarely finishes it. She may eat 4-5 fries. Or few bites of salad. She feels up quickly. She does not eat meat, chicken or fish. Sometimes eats pinto beans, egg whites. She does not use Ensure or Boost regularly. Intake consists largely of  coffee, sparkling water, small amounts of snack foods, and frequent Butterfinger candy bars. She really did not take Megace very long. No side effects but thought it was not helping. Has been of it for two months.   She has persistent early satiety with rapid fullness limiting meal size. Intermittent upper abdominal stabbing pain occurs immediately after eating, lasts 5 to 10 minutes, may involve the entire upper abdomen, and improves with bending over and passing gas. She notes no visible abdominal distension and denies heartburn, indigestion, and dysphagia.  Bowel movements occur every 2 to 3 days with long, smooth stools. She denies constipation, straining, hard stools, hematochezia, and melena. Passing gas relieves abdominal pain. No dysuria.       Prior Data     Results    Labs July 19, 2024: TSH 0.718, T41.21 White blood cell count 3.9, hemoglobin 12.9, platelets 263 BUN 8, creatinine 0.83, albumin 4.3, total bilirubin 0.4, alk phos 81, AST 25, ALT 13  CTA abdomen and pelvis with contrast September 30, 2024: IMPRESSION: 1. No evidence of acute mesenteric ischemia. 2. Mild calcifications at the origins of the celiac trunk and superior mesenteric artery without high-grade stenosis.   Wt Readings from Last 10 Encounters:  10/11/24 103 lb 3.2 oz (46.8 kg)  07/31/24 97 lb (44 kg)  07/31/24 95 lb 11.2 oz (43.4 kg)  03/08/22 126 lb 3.2 oz (57.2 kg)  01/08/21 132 lb 9.6 oz (60.1 kg)  09/20/19 132 lb (59.9 kg)  09/04/18 122 lb 9.6 oz (55.6 kg)  03/28/18 115 lb (52.2 kg)  03/20/18 110 lb 6.4 oz (50.1 kg)  08/07/17 111 lb (50.3 kg)     Medications   Current Outpatient Medications  Medication Sig Dispense Refill   amLODipine  (NORVASC ) 10 MG tablet TAKE ONE TABLET BY MOUTH (10MG  TOTAL) DAILY 90 tablet 2   aspirin  EC 81 MG tablet Take 81 mg by mouth daily at 12 noon.     atorvastatin  (LIPITOR) 40 MG tablet Take 1 tablet (40 mg total) by mouth daily. 90 tablet 3    clopidogrel  (PLAVIX ) 75 MG tablet TAKE ONE TABLET (75MG  TOTAL) BY MOUTH DAILY 30 tablet 10   feeding supplement, ENSURE ENLIVE, (ENSURE ENLIVE) LIQD Take 237 mLs by mouth 2 (two) times daily between meals. (Patient taking differently: Take 237 mLs by mouth 2 (two) times daily between meals. PRN) 237 mL 12   lisinopril  (ZESTRIL ) 20 MG tablet TAKE ONE TABLET (20MG  TOTAL) BY MOUTH DAILY 90 tablet 2   nitroGLYCERIN  (NITROSTAT ) 0.4 MG SL tablet Place 1 tablet (0.4 mg total) under the tongue every 5 (five) minutes as needed for chest pain. 25 tablet 1   polyethylene glycol powder (GLYCOLAX /MIRALAX ) 17 GM/SCOOP powder Dissolve 1 capful (17g) in 4-8 ounces of liquid and take by mouth daily. If your stools become too loose/frequent, you can take every other day     No current facility-administered medications for this visit.    Allergies   Allergies as of 10/11/2024   (No Known Allergies)     Past Medical History   Past Medical History:  Diagnosis Date   Coronary artery disease 09/2009   stress test EF 67%   Hypercholesteremia    Hypertension    Myocardial infarction [facility])     Past Surgical History   Past Surgical History:  Procedure Laterality Date   BREAST CYST EXCISION     fibro cyst removal   CORONARY ANGIOPLASTY WITH STENT PLACEMENT Right 07/2005   receiving two consecutive drug eluting 2.5x74mm Taxus stents in the proximal to mid right coronary artery   TONSILLECTOMY      Past Family History   Family History  Problem Relation Age of Onset   Stroke Mother 34       deceased   Cancer Sister 56       deceased    Past Social History   Social History   Socioeconomic History   Marital status: Divorced    Spouse name: Not on file   Number of children: Not on file   Years of education: Not on file   Highest education level: Not on file  Occupational History   Not on file  Tobacco Use   Smoking status: Never   Smokeless tobacco: Never  Substance and Sexual Activity    Alcohol use: No   Drug use: No   Sexual activity: Not Currently  Other Topics Concern   Not on file  Social History Narrative   Not on file   Social Drivers of Health   Tobacco Use: Low Risk (10/11/2024)   Patient History    Smoking Tobacco Use: Never    Smokeless Tobacco Use: Never    Passive Exposure: Not on file  Financial Resource Strain: Not on file  Food Insecurity: No Food Insecurity (12/23/2023)   Hunger Vital Sign    Worried About Running Out of Food in the Last Year: Never true    Ran Out of Food  in the Last Year: Never true  Transportation Needs: No Transportation Needs (12/23/2023)   PRAPARE - Administrator, Civil Service (Medical): No    Lack of Transportation (Non-Medical): No  Physical Activity: Not on file  Stress: Not on file  Social Connections: Not on file  Intimate Partner Violence: Not At Risk (12/23/2023)   Humiliation, Afraid, Rape, and Kick questionnaire    Fear of Current or Ex-Partner: No    Emotionally Abused: No    Physically Abused: No    Sexually Abused: No  Depression (PHQ2-9): Low Risk (07/31/2024)   Depression (PHQ2-9)    PHQ-2 Score: 0  Alcohol Screen: Not on file  Housing: Low Risk (12/23/2023)   Housing Stability Vital Sign    Unable to Pay for Housing in the Last Year: No    Number of Times Moved in the Last Year: 0    Homeless in the Last Year: No  Utilities: Not At Risk (12/23/2023)   AHC Utilities    Threatened with loss of utilities: No  Health Literacy: Not on file    Review of Systems   General: Negative for  fever, chills, fatigue, weakness.see hpi ENT: Negative for hoarseness, difficulty swallowing , nasal congestion. CV: Negative for chest pain, angina, palpitations, dyspnea on exertion, peripheral edema.  Respiratory: Negative for dyspnea at rest, dyspnea on exertion, cough, sputum, wheezing.  GI: See history of present illness. GU:  Negative for dysuria, hematuria, urinary incontinence, urinary frequency,  nocturnal urination.  Endo: see hpi    Physical Exam   BP 135/68 (BP Location: Right Arm, Patient Position: Sitting, Cuff Size: Normal)   Pulse 69   Temp 98.5 F (36.9 C) (Temporal)   Ht 5' (1.524 m)   Wt 103 lb 3.2 oz (46.8 kg)   BMI 20.15 kg/m    General: Well-nourished, well-developed in no acute distress. Sister present. Eyes: No icterus. Mouth: Oropharyngeal mucosa moist and pink   Lungs: Clear to auscultation bilaterally.  Heart: 2/6 SEM, Regular rate and rhythm, no rubs or gallops.  Abdomen: Bowel sounds are normal,   nondistended, no hepatosplenomegaly or masses,  no abdominal bruits or hernia , no rebound or guarding. Mild suprapubic tenderness Rectal: not performed Extremities: No lower extremity edema. No clubbing or deformities. Neuro: Alert and oriented x 4   Skin: Warm and dry, no jaundice.   Psych: Alert and cooperative, normal mood and affect.  Labs   See hpi  Imaging Studies   CT Angio Abd/Pel w/ and/or w/o Result Date: 10/09/2024 EXAM: CTA ABDOMEN AND PELVIS WITH AND WITHOUT CONTRAST 09/30/2024 04:10:00 PM TECHNIQUE: CTA images of the abdomen and pelvis without and with intravenous contrast. Three-dimensional MIP/volume rendered formations were performed. Automated exposure control, iterative reconstruction, and/or weight based adjustment of the mA/kV was utilized to reduce the radiation dose to as low as reasonably achievable. CONTRAST: 75 mL of Omnipaque . COMPARISON: None available. CLINICAL HISTORY: Mesenteric ischemia, chronic, Loss of weight, Poor appetite, Abdominal pain of multiple sites ; 25lb weight loss over 4-5 months per patient FINDINGS: VASCULATURE: AORTA: No acute finding. No abdominal aortic aneurysm. No dissection. CELIAC TRUNK: Mild osteocalcifications at the origin. No high grade stenosis. No occlusion. SUPERIOR MESENTERIC ARTERY: Mild osteocalcifications at the origin. No high grade stenosis. No occlusion. RENAL ARTERIES: No acute finding. No  occlusion or significant stenosis. ILIAC ARTERIES: No acute finding. No occlusion or significant stenosis. LIVER: The liver is unremarkable. GALLBLADDER AND BILE DUCTS: Gallbladder is unremarkable. No biliary ductal dilatation. SPLEEN:  The spleen is unremarkable. PANCREAS: The pancreas is unremarkable. ADRENAL GLANDS: Bilateral adrenal glands demonstrate no acute abnormality. KIDNEYS, URETERS AND BLADDER: No stones in the kidneys or ureters. No hydronephrosis. No perinephric or periureteral stranding. Urinary bladder is unremarkable. GI AND BOWEL: Stomach and duodenal sweep demonstrate no acute abnormality. There is no bowel obstruction. No abnormal bowel wall thickening or distension. No evidence of small bowel dilatation or edema to suggest mesenteric ischemia. No abnormal enhancement. Moderate volume of stool throughout the colon. REPRODUCTIVE: Calcified fibroid uterus noted. PERITONEUM AND RETROPERITONEUM: No ascites or free air. LUNG BASE: No acute abnormality. LYMPH NODES: No lymphadenopathy. BONES AND SOFT TISSUES: No acute abnormality of the bones. No acute soft tissue abnormality. IMPRESSION: 1. No evidence of acute mesenteric ischemia. 2. Mild calcifications at the origins of the celiac trunk and superior mesenteric artery without high-grade stenosis. Electronically signed by: Norleen Boxer MD 10/09/2024 10:02 AM EST RP Workstation: [id number]    Assessment/Plan:   Assessment and Plan Assessment & Plan Unintentional weight loss with early satiety and abdominal pain Chronic weight loss with early satiety and abdominal pain of unclear etiology. CT imaging excluded obstruction or acute pathology. Further evaluation for upper GI causes indicated. - Recommended upper endoscopy (EGD) to assess for gastritis, ulcer, or other upper GI pathology. - Arranged scheduling of upper endoscopy and provided anticipatory guidance regarding the procedure. - Deferred colonoscopy per her preference to proceed with  upper endoscopy first.  Constipation Chronic mild constipation with moderate stool burden and mild lower abdominal tenderness, likely due to slow colonic transit and low oral intake. - Recommended trial of daily Miralax  (polyethylene glycol) to improve stool frequency and alleviate abdominal discomfort. - Provided written instructions for Miralax  dosing and titration.  Recording duration: 20 minutes    Egd for now Hold off on tcs ASA 3 Hold plavix  five days.       Sonny RAMAN. Ezzard, MHS, PA-C [facility]  "

## 2024-10-11 NOTE — Telephone Encounter (Signed)
" °  Request for patient to stop medication prior to procedure or is needing cleareance  10/11/2024  NEAH SPORRER 19-Oct-1945  What type of surgery is being performed? Esophagogastroduodenoscopy (EGD)  When is surgery scheduled? TBD  What type of clearance is required (medical or pharmacy to hold medication or both? medication  Are there any medications that need to be held prior to surgery and how long? Plavix  x 5 days  Name of physician performing surgery?  Dr.Carver  [facility] [employer]: [phone], option 5 Fax: [phone]  Anesthesia type (none, local, MAC, general)? MAC   ? Yes ? No Patient can hold medication as requested   Signature: ___________________________   "

## 2024-10-11 NOTE — Patient Instructions (Signed)
 For poor appetite, upper abdominal pain, early satiety: -continue to try and eat small meals, multiple times per day to get adequate number of calories -add a nutrition drink each day, Boost/Ensure or similar product -we will arrange for upper endoscopy in the near future. You will have to hold your plavix  for five days before. We will discuss this with your cardiologist.  -I will hold off on medication for possible gastritis/ulcers for now. May be necessary based on endoscopy findings.  For lower abdominal pain: -I am adding miralax  one capful daily to encourage more frequent stools to see if this helps your lower abdominal pain and gas. Be consistent and take this for several weeks before deciding if it is helpful or not. It will not work immediately. -if your stools become too loose/frequent, you can take miralax  every other day -it is ok to buy the store brand of miralax , it will be much cheaper -make sure you drink at least 50 ounces of water each day

## 2024-10-11 NOTE — Telephone Encounter (Signed)
" ° °  Name: Ann Patel  DOB: 1945-12-31  MRN: [id number]  Primary Cardiologist: Jerel Balding, MD  Chart reviewed as part of pre-operative protocol coverage. Because of Ann Patel's past medical history and time since last visit, she will require a follow-up in-office visit in order to better assess preoperative cardiovascular risk.  Pre-op covering staff: - Please schedule appointment and call patient to inform them. If patient already had an upcoming appointment within acceptable timeframe, please add pre-op clearance to the appointment notes so provider is aware. - Please contact requesting surgeon's office via preferred method (i.e, phone, fax) to inform them of need for appointment prior to surgery.  It is unclear if the patient still taking Plavix , last prescription was sent in 2023.  Patient has not been seen in over 2-1/2 years.  Ann Patel, [name]  10/11/2024, 10:26 AM   "

## 2024-10-11 NOTE — Telephone Encounter (Signed)
 Pt has been scheduled in office appt Lum Louis, NP 10/14/24 @ 3:10 for preop clearance. Pt has not been seen in 2 1/2 yrs., I did confirm with the pt if she is still taking Plavix  , she answered yes.

## 2024-10-14 ENCOUNTER — Ambulatory Visit: Attending: Emergency Medicine | Admitting: Emergency Medicine

## 2024-10-14 ENCOUNTER — Encounter: Payer: Self-pay | Admitting: Emergency Medicine

## 2024-10-14 VITALS — BP 122/50 | HR 72 | Ht 60.0 in | Wt 102.0 lb

## 2024-10-14 DIAGNOSIS — I10 Essential (primary) hypertension: Secondary | ICD-10-CM | POA: Diagnosis not present

## 2024-10-14 DIAGNOSIS — I2510 Atherosclerotic heart disease of native coronary artery without angina pectoris: Secondary | ICD-10-CM

## 2024-10-14 DIAGNOSIS — E785 Hyperlipidemia, unspecified: Secondary | ICD-10-CM | POA: Diagnosis not present

## 2024-10-14 DIAGNOSIS — I350 Nonrheumatic aortic (valve) stenosis: Secondary | ICD-10-CM | POA: Diagnosis not present

## 2024-10-14 DIAGNOSIS — Z01810 Encounter for preprocedural cardiovascular examination: Secondary | ICD-10-CM | POA: Diagnosis not present

## 2024-10-14 NOTE — Patient Instructions (Addendum)
 Medication Instructions:  NO CHANGES  Lab Work: NONE TO BE DONE TODAY.  Testing/Procedures: TO BE DONE THIS SUMMER 2026  Your physician has requested that you have an echocardiogram. Echocardiography is a painless test that uses sound waves to create images of your heart. It provides your doctor with information about the size and shape of your heart and how well your hearts chambers and valves are working. This procedure takes approximately one hour. There are no restrictions for this procedure. Please do NOT wear cologne, perfume, aftershave, or lotions (deodorant is allowed). Please arrive 15 minutes prior to your appointment time.  Please note: We ask at that you not bring children with you during ultrasound (echo/ vascular) testing. Due to room size and safety concerns, children are not allowed in the ultrasound rooms during exams. Our front office staff cannot provide observation of children in our lobby area while testing is being conducted. An adult accompanying a patient to their appointment will only be allowed in the ultrasound room at the discretion of the ultrasound technician under special circumstances. We apologize for any inconvenience.   Follow-Up: At [facility], you and your health needs are our priority.  As part of our continuing mission to provide you with exceptional heart care, our providers are all part of one team.  This team includes your primary Cardiologist (physician) and Advanced Practice Providers or APPs (Physician Assistants and Nurse Practitioners) who all work together to provide you with the care you need, when you need it.  Your next appointment:   1 YEAR  Provider:   Jerel Balding, MD OR Lum Louis, NP   Other Instructions:

## 2024-10-14 NOTE — Progress Notes (Signed)
 " Cardiology Office Note:    Date:  10/14/2024  ID:  Ann Patel, DOB 06-13-1946, MRN [id number] PCP: Gladis Lauraine BRAVO, NP  [facility] Providers Cardiologist:  Jerel Balding, MD       Patient Profile:       Chief Complaint: Preoperative clearance History of Present Illness:  Ann Patel is a 79 y.o. female with visit-pertinent history of coronary artery disease, mild aortic stenosis, hypertension, hyperlipidemia  She has history of coronary stent to the right coronary artery in 2006 and has not had any coronary events since that time.  Her last functional study was a low risk nuclear stress test in 2010 showing medium size mid-- basal inferior scar without ischemia.  She did have preserved left ventricular systolic function.  She has history of mild aortic valve stenosis that has not progressed according to echocardiograms in 2019 and 2023.  Her last echocardiogram was on 01/13/2022 showing LVEF 60 to 65%, no RWMA, mild LVH, grade 1 DD, RV function size normal, left atrial size moderately dilated, mild mitral valve regurgitation, moderate aortic valve stenosis.  Last seen in clinic by Dr. Balding on 03/08/2022.  She was asymptomatic despite a very active lifestyle.  No changes were made she was to follow-up in 1 year.   Discussed the use of AI scribe software for clinical note transcription with the patient, who gave verbal consent to proceed.  History of Present Illness Ann Patel is a 79 year old female with coronary artery disease and aortic stenosis who presents for cardiac clearance prior to an EGD procedure and routine follow-up visit.  Today patient tells me she is doing well without acute cardiovascular concerns or complaints.  She is very active and participates in cornhole, pool, and walking routinely without chest discomfort or dyspnea on exertion.  Her blood pressure has been well-controlled at home.  She reports significant weight loss, which is the  reason for the planned EGD. She denies chest pain, shortness of breath, palpitations, or syncope.  She is without orthopnea, PND, LEE, lightheadedness, dizziness, melena, hematochezia   Review of systems:  Please see the history of present illness. All other systems are reviewed and otherwise negative.      Studies Reviewed:    EKG Interpretation Date/Time:  Monday October 14 2024 15:01:00 EST Ventricular Rate:  72 PR Interval:  164 QRS Duration:  80 QT Interval:  394 QTC Calculation: 431 R Axis:   38  Text Interpretation: Normal sinus rhythm Normal ECG When compared with ECG of 19-Mar-2018 22:58, PREVIOUS ECG IS PRESENT Confirmed by Rana Dixon [phone]) on 10/14/2024 4:05:08 PM    Echocardiogram 01/13/2022  1. Left ventricular ejection fraction, by estimation, is 60 to 65%. The  left ventricle has normal function. The left ventricle has no regional  wall motion abnormalities. There is mild concentric left ventricular  hypertrophy. Left ventricular diastolic  parameters are consistent with Grade I diastolic dysfunction (impaired  relaxation).   2. Right ventricular systolic function is normal. The right ventricular  size is normal.   3. Left atrial size was moderately dilated.   4. The mitral valve is degenerative. Mild mitral valve regurgitation. No  evidence of mitral stenosis.   5. The aortic valve is tricuspid. There is mild calcification of the  aortic valve. Aortic valve regurgitation is trivial. Mild aortic valve  stenosis.   6. The inferior vena cava is normal in size with greater than 50%  respiratory variability, suggesting right atrial pressure  of 3 mmHg.   Echocardiogram 04/12/2018 - Left ventricle: The cavity size was normal. Wall thickness was    increased in a pattern of mild LVH. Systolic function was normal.    The estimated ejection fraction was in the range of 55% to 60%.    There is akinesis of the inferolateral myocardium. Doppler    parameters are  consistent with abnormal left ventricular    relaxation (grade 1 diastolic dysfunction).  - Mitral valve: There was mild regurgitation.  - Pulmonary arteries: Systolic pressure was mildly increased. PA    peak pressure: 39 mm Hg (S).  Risk Assessment/Calculations:              Physical Exam:   VS:  BP (!) 122/50 (BP Location: Left Arm, Patient Position: Sitting, Cuff Size: Normal)   Pulse 72   Ht 5' (1.524 m)   Wt 102 lb (46.3 kg)   BMI 19.92 kg/m    Wt Readings from Last 3 Encounters:  10/14/24 102 lb (46.3 kg)  10/11/24 103 lb 3.2 oz (46.8 kg)  07/31/24 97 lb (44 kg)    GEN: Well nourished, well developed in no acute distress NECK: No JVD; No carotid bruits CARDIAC: RRR.  3/6 systolic murmur RESPIRATORY:  Clear to auscultation without rales, wheezing or rhonchi  ABDOMEN: Soft, non-tender, non-distended EXTREMITIES:  No edema; No acute deformity      Assessment and Plan:  Coronary artery disease S/p stent to RCA in 2006 Last functional study was low risk nuclear stress test in 2010 showing medium size mid-- basal inferior scar without ischemia - EKG without acute ischemic features - Today patient is stable without chest pains.  She remains very active without exertional symptoms.  No indication for further ischemic evaluation at this time - Continue aspirin  81 mg daily, clopidogrel  75 mg daily, atorvastatin  40 mg daily, amlodipine  10 mg daily, and lisinopril  20 mg daily  Hyperlipidemia, LDL goal <70 LDL 68 on 07/2024 and well-controlled - Continue atorvastatin  40 mg daily  Hypertension Blood pressure today is 122/50 and controlled - Continue amlodipine  10 mg daily and lisinopril  20 mg daily  Mild aortic stenosis Echocardiogram 01/2022 showed mild aortic valve stenosis - Today she remains asymptomatic without chest pains, syncope, dyspnea, or exertional symptoms - Plan for repeat echocardiogram for routine monitoring as it has been 3 years since last  exam  Preoperative cardiovascular clearance According to the Revised Cardiac Risk Index (RCRI), her Perioperative Risk of Major Cardiac Event is (%): 0.9. Her Functional Capacity in METs is: 7.34 according to the Duke Activity Status Index (DASI).  Therefore, based on ACC/AHA guidelines, patient would be at acceptable risk for the planned procedure without further cardiovascular testing. I will route this recommendation to the requesting party via Epic fax function.  She may hold Plavix  for 5 days prior to procedure. Please resume Plavix  as soon as possible postprocedure, at the discretion of the surgeon.        Dispo:  Return in about 1 year (around 10/14/2025).  Signed, Lum LITTIE Louis, NP  "

## 2024-10-15 NOTE — Telephone Encounter (Signed)
 Pt had a telephone visit yesterday. Please advise. Thank you

## 2024-10-17 ENCOUNTER — Encounter: Payer: Self-pay | Admitting: *Deleted

## 2024-10-17 NOTE — Telephone Encounter (Signed)
 Pt has been scheduled for 11/22/24. Instructions sent via mychart.  [employer] [facility] for EGD: Approved Authorization #[id number]  Tracking #[id number] Dates of service 11/22/2024 - 02/21/2025

## 2024-10-17 NOTE — Telephone Encounter (Signed)
 Ok to schedule.  Hold plavix  5 days before.

## 2024-11-19 ENCOUNTER — Encounter (HOSPITAL_COMMUNITY)

## 2024-11-22 ENCOUNTER — Encounter (HOSPITAL_COMMUNITY): Payer: Self-pay

## 2024-11-22 ENCOUNTER — Ambulatory Visit (HOSPITAL_COMMUNITY): Admit: 2024-11-22 | Admitting: Internal Medicine

## 2024-11-22 SURGERY — EGD (ESOPHAGOGASTRODUODENOSCOPY)
Anesthesia: Choice

## 2025-04-21 ENCOUNTER — Ambulatory Visit (HOSPITAL_COMMUNITY)
